# Patient Record
Sex: Male | Born: 1975 | Hispanic: Yes | Marital: Married | Smoking: Never smoker
Health system: Southern US, Community
[De-identification: ages and names within clinical notes are randomized; demographics above are authoritative.]

## PROBLEM LIST (undated history)

## (undated) DIAGNOSIS — J302 Other seasonal allergic rhinitis: Secondary | ICD-10-CM

## (undated) DIAGNOSIS — I10 Essential (primary) hypertension: Secondary | ICD-10-CM

## (undated) DIAGNOSIS — E119 Type 2 diabetes mellitus without complications: Secondary | ICD-10-CM

## (undated) DIAGNOSIS — E785 Hyperlipidemia, unspecified: Secondary | ICD-10-CM

## (undated) DIAGNOSIS — K439 Ventral hernia without obstruction or gangrene: Secondary | ICD-10-CM

## (undated) HISTORY — DX: Essential (primary) hypertension: I10

## (undated) HISTORY — DX: Ventral hernia without obstruction or gangrene: K43.9

## (undated) HISTORY — DX: Hyperlipidemia, unspecified: E78.5

## (undated) HISTORY — DX: Type 2 diabetes mellitus without complications: E11.9

## (undated) HISTORY — DX: Other seasonal allergic rhinitis: J30.2

---
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
## 2008-09-01 ENCOUNTER — Emergency Department (HOSPITAL_COMMUNITY): Admission: EM | Admit: 2008-09-01 | Discharge: 2008-09-01 | Payer: Self-pay | Admitting: Family Medicine

## 2011-03-02 ENCOUNTER — Other Ambulatory Visit (HOSPITAL_COMMUNITY): Payer: Self-pay | Admitting: Internal Medicine

## 2011-03-05 ENCOUNTER — Other Ambulatory Visit (HOSPITAL_COMMUNITY): Payer: Self-pay | Admitting: Internal Medicine

## 2011-03-05 DIAGNOSIS — R1032 Left lower quadrant pain: Secondary | ICD-10-CM

## 2011-03-09 ENCOUNTER — Ambulatory Visit (HOSPITAL_COMMUNITY)
Admission: RE | Admit: 2011-03-09 | Discharge: 2011-03-09 | Disposition: A | Discharge status: Home / Self Care | Payer: Self-pay | Source: Ambulatory Visit | Attending: Internal Medicine | Admitting: Internal Medicine

## 2011-03-09 ENCOUNTER — Other Ambulatory Visit (HOSPITAL_COMMUNITY): Payer: Self-pay | Admitting: Internal Medicine

## 2011-03-09 DIAGNOSIS — R1032 Left lower quadrant pain: Secondary | ICD-10-CM

## 2011-03-09 DIAGNOSIS — N200 Calculus of kidney: Secondary | ICD-10-CM | POA: Insufficient documentation

## 2011-03-09 DIAGNOSIS — R109 Unspecified abdominal pain: Secondary | ICD-10-CM | POA: Insufficient documentation

## 2013-04-08 IMAGING — CT CT ABD-PELV W/O CM
2 of 4 series · 17 of 46 positions shown, 19 images · non-contrast
Comparison: None.

CLINICAL DATA: Left quadrant abdominal pain.  No nausea or
vomiting.

CT ABDOMEN AND PELVIS WITHOUT CONTRAST
TECHNIQUE: Multidetector CT imaging of the abdomen and pelvis was
performed following the standard protocol without intravenous
contrast.

[Series 2: abd/pelv w/o 5.0 b31f st · axial · non-contrast · 0.77mm/px · z∈[-492,-2]mm · 14 of 108 slices shown, 16 images]
[im 5/108  soft-tissue]
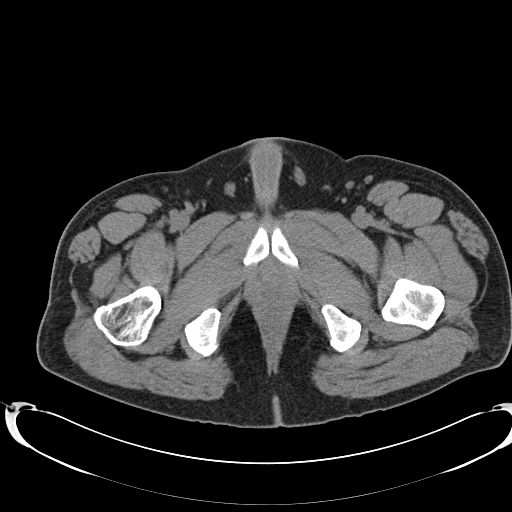
[im 5/108  bone]
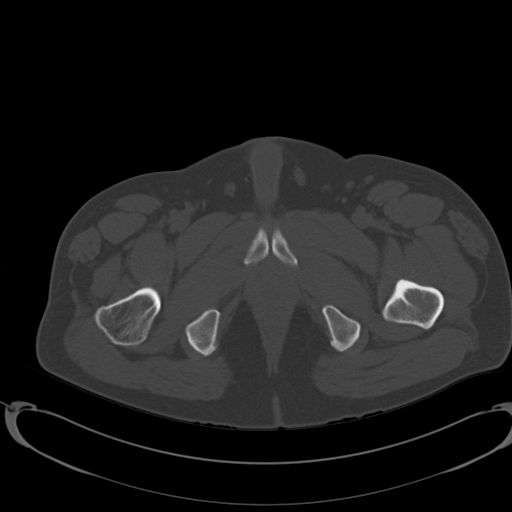
[im 14/108  soft-tissue]
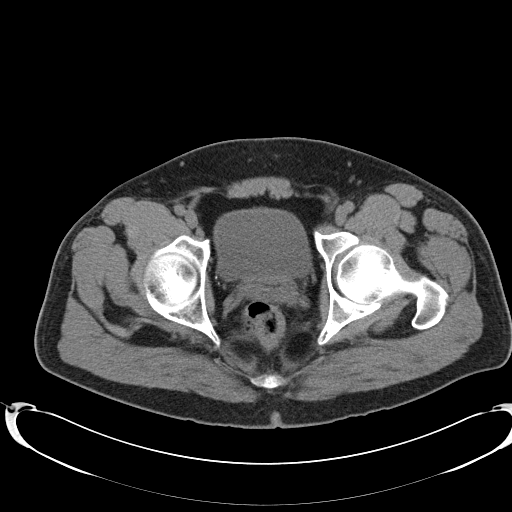
[im 23/108  soft-tissue]
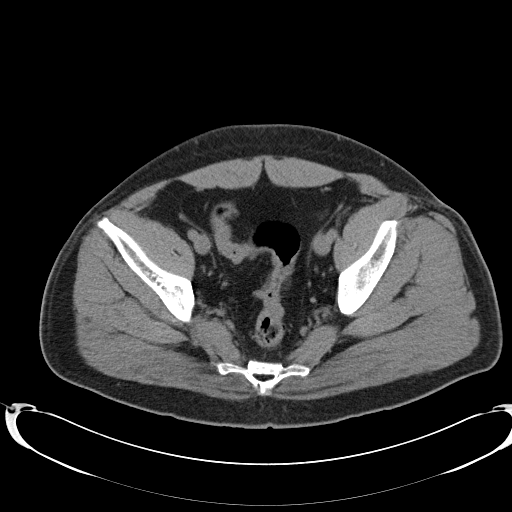
[im 27/108  soft-tissue]
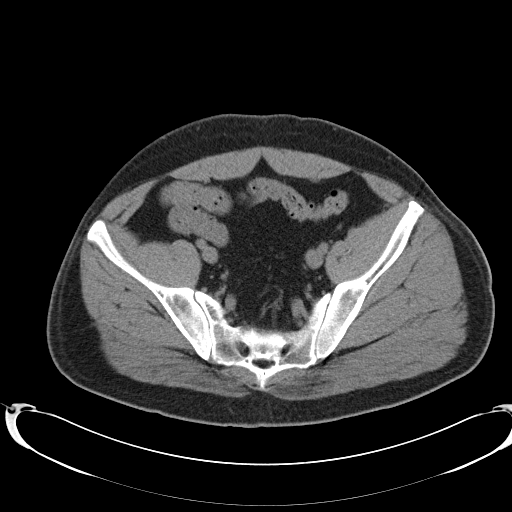
[im 36/108  soft-tissue]
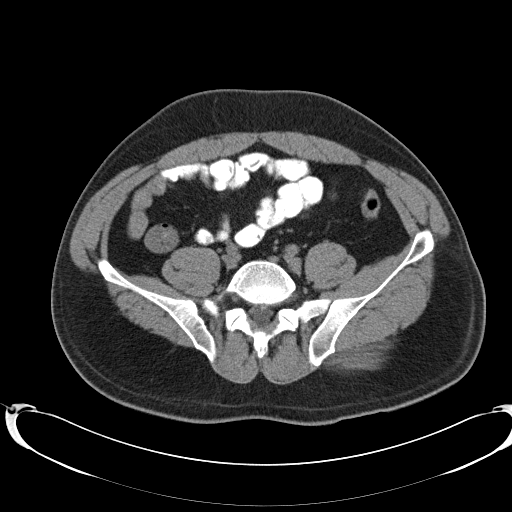
[im 45/108  soft-tissue]
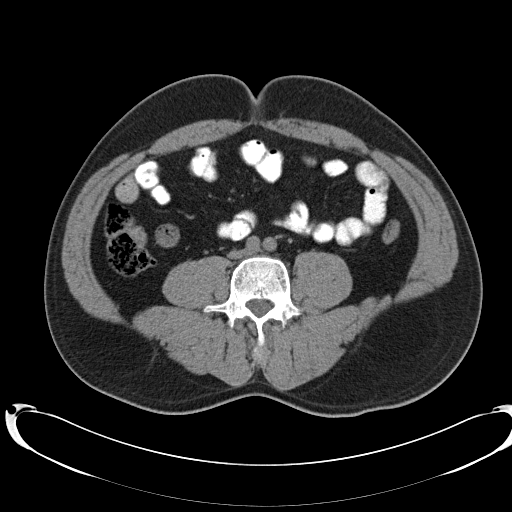
[im 50/108  soft-tissue]
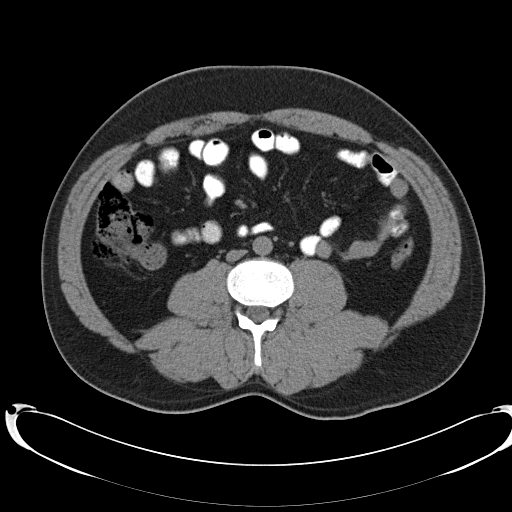
[im 58/108  soft-tissue]
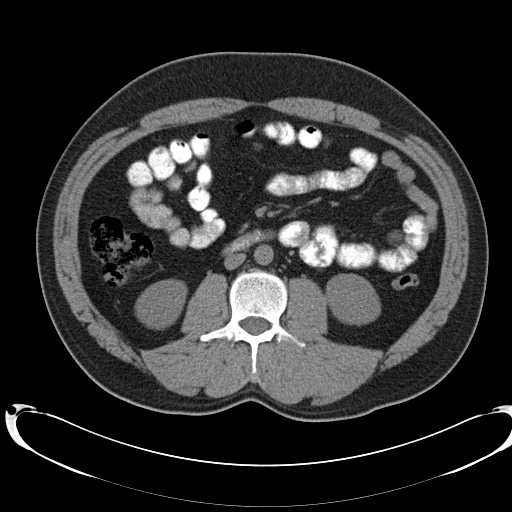
[im 63/108  soft-tissue]
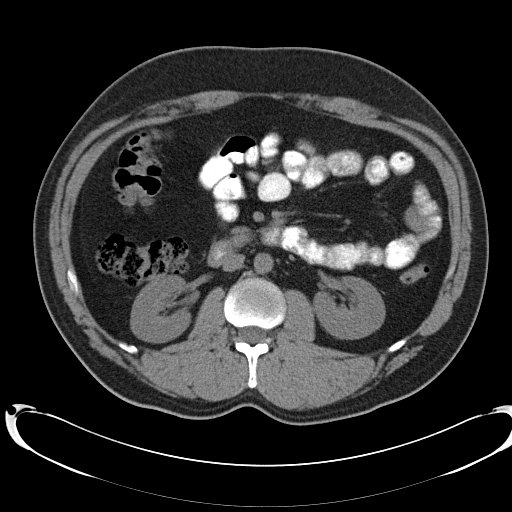
[im 63/108  bone]
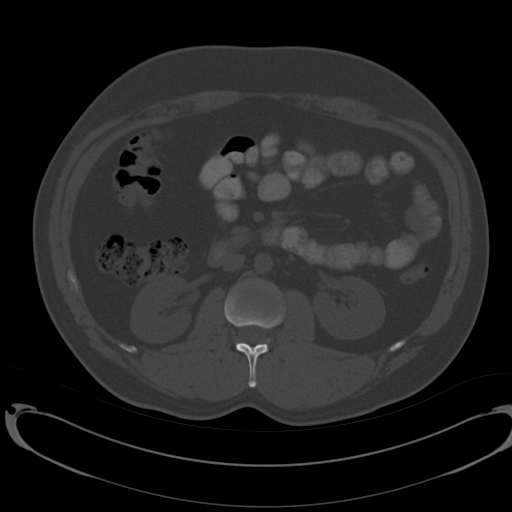
[im 72/108  soft-tissue]
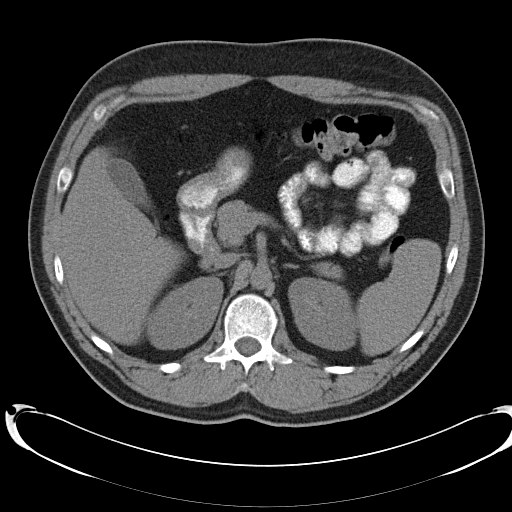
[im 81/108  soft-tissue]
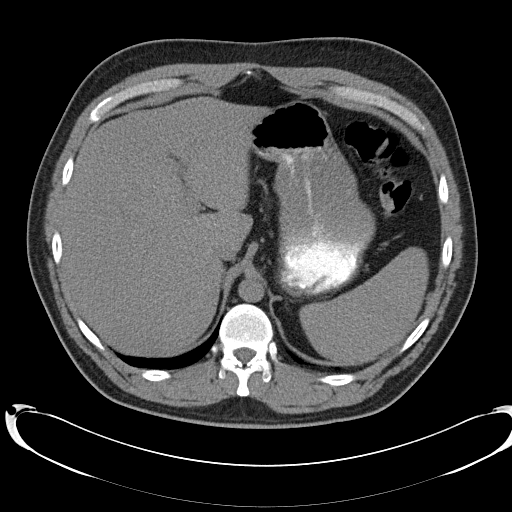
[im 85/108  soft-tissue]
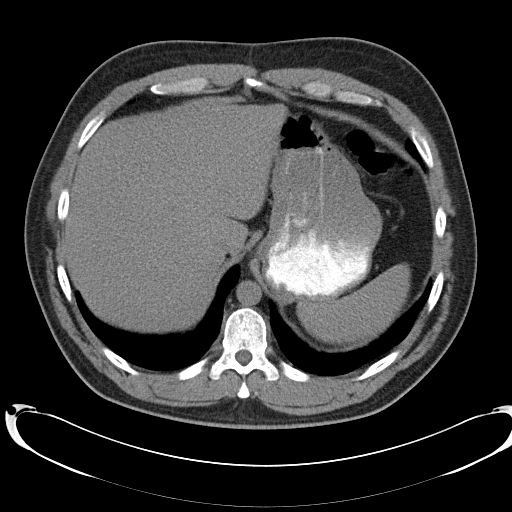
[im 94/108  soft-tissue]
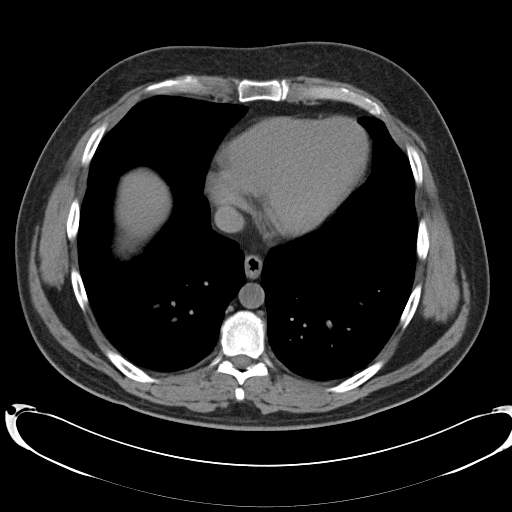
[im 103/108  soft-tissue]
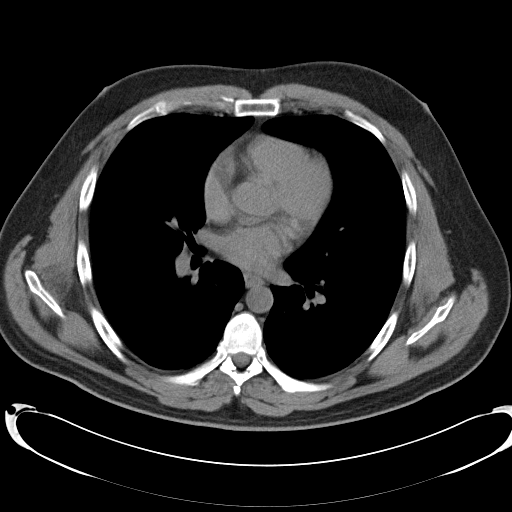

[Series 5: abd/pelv w/o 2.0 spo cor thins · coronal · non-contrast · 1.05mm/px · 3 of 112 slices shown]
[im 38/112  soft-tissue]
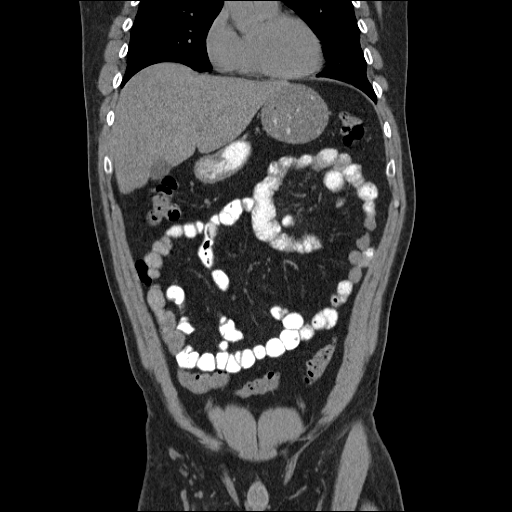
[im 50/112  soft-tissue]
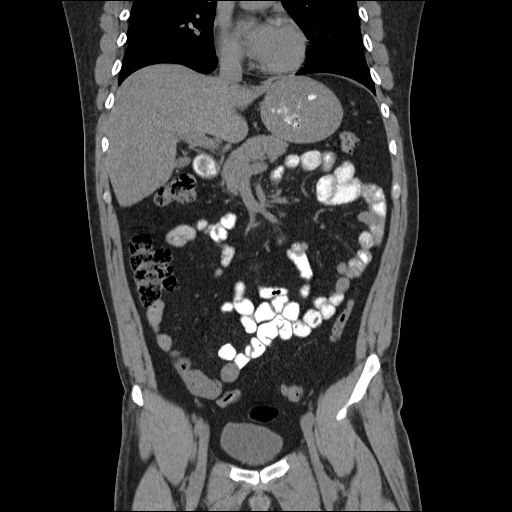
[im 62/112  soft-tissue]
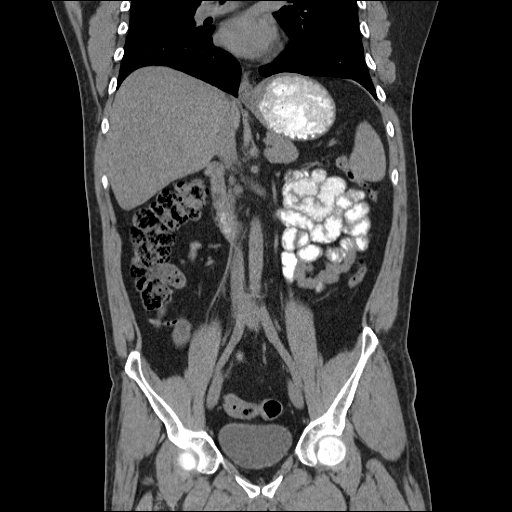

[17 of 46 positions shown; findings below may reference images not displayed]

FINDINGS: Lung bases show dependent atelectasis.  Heart appears
within normal limits.

Unenhanced CT was performed per clinician order.  Lack of IV
contrast limits sensitivity and specificity, especially for
evaluation of abdominal/pelvic solid viscera.  90th appearance of
the liver is within normal limits.  Gallbladder appears normal.  No
calcified gallstones.  Pancreas and common bile duct appear normal.
The spleen is unremarkable.  Normal adrenal glands bilaterally.

Small left inferior pole renal calculus is present measuring 4 mm
long axis.  The right kidney appears within normal limits.  No
right renal calculi are present.

No abdominal adenopathy.  Stomach appears within normal limits.
Small bowel is normal.  The appendix is normal.  Colon is within
normal limits.  Urinary bladder unremarkable.  Both ureters are
normal.  No perinephric stranding, periureteric stranding or
hydronephrosis.  Bones appear within normal limits.  No aggressive
osseous lesions are identified.  Partial visualization of right
proximal femur ORIF.
IMPRESSION: 1.  No acute abnormality.
2.  Nonobstructing 4 mm left inferior pole renal collecting system
calculus.
3.  Postoperative changes of proximal right femur ORIF.

## 2015-01-06 ENCOUNTER — Emergency Department (HOSPITAL_COMMUNITY)
Admission: EM | Admit: 2015-01-06 | Discharge: 2015-01-06 | Disposition: A | Discharge status: Home / Self Care | Payer: 59 | Attending: Emergency Medicine | Admitting: Emergency Medicine

## 2015-01-06 ENCOUNTER — Encounter (HOSPITAL_COMMUNITY): Payer: Self-pay

## 2015-01-06 DIAGNOSIS — R739 Hyperglycemia, unspecified: Secondary | ICD-10-CM | POA: Insufficient documentation

## 2015-01-06 DIAGNOSIS — Z8639 Personal history of other endocrine, nutritional and metabolic disease: Secondary | ICD-10-CM | POA: Insufficient documentation

## 2015-01-06 DIAGNOSIS — R631 Polydipsia: Secondary | ICD-10-CM | POA: Diagnosis not present

## 2015-01-06 LAB — URINALYSIS, ROUTINE W REFLEX MICROSCOPIC
Bilirubin Urine: NEGATIVE
Glucose, UA: >1000 mg/dL — AB
Ketones, ur: 40 mg/dL — AB
LEUKOCYTES UA: NEGATIVE
Nitrite: NEGATIVE
Protein, ur: 100 mg/dL — AB
Specific Gravity, Urine: 1.04 — ABNORMAL HIGH (ref 1.005–1.030)
Urobilinogen, UA: 0.2 mg/dL (ref 0.0–1.0)
pH: 6 (ref 5.0–8.0)

## 2015-01-06 LAB — CBC
HCT: 43.3 % (ref 39.0–52.0)
Hemoglobin: 15.9 g/dL (ref 13.0–17.0)
MCH: 32.9 pg (ref 26.0–34.0)
MCHC: 36.7 g/dL — ABNORMAL HIGH (ref 30.0–36.0)
MCV: 89.6 fL (ref 78.0–100.0)
PLATELETS: 187 10*3/uL (ref 150–400)
RBC: 4.83 MIL/uL (ref 4.22–5.81)
RDW: 12.5 % (ref 11.5–15.5)
WBC: 5.2 10*3/uL (ref 4.0–10.5)

## 2015-01-06 LAB — BASIC METABOLIC PANEL
ANION GAP: 9 (ref 5–15)
BUN: 11 mg/dL (ref 6–20)
CO2: 23 mmol/L (ref 22–32)
Calcium: 9.5 mg/dL (ref 8.9–10.3)
Chloride: 98 mmol/L — ABNORMAL LOW (ref 101–111)
Creatinine, Ser: 0.55 mg/dL — ABNORMAL LOW (ref 0.61–1.24)
GFR calc non Af Amer: >60 mL/min (ref >60)
GLUCOSE: 341 mg/dL — AB (ref 65–99)
POTASSIUM: 4.8 mmol/L (ref 3.5–5.1)
Sodium: 130 mmol/L — ABNORMAL LOW (ref 135–145)

## 2015-01-06 LAB — CBG MONITORING, ED: GLUCOSE-CAPILLARY: 331 mg/dL — AB (ref 65–99)

## 2015-01-06 LAB — URINE MICROSCOPIC-ADD ON

## 2015-01-06 MED ORDER — METFORMIN HCL 500 MG PO TABS
500.0000 mg | ORAL_TABLET | Freq: Once | ORAL | Status: AC
  Administered 2015-01-06: 500 mg via ORAL
  Filled 2015-01-06: qty 1

## 2015-01-06 MED ORDER — METFORMIN HCL 500 MG PO TABS: 500.0000 mg | ORAL_TABLET | Freq: Two times a day (BID) | ORAL | Status: DC

## 2015-01-06 MED ORDER — SODIUM CHLORIDE 0.9 % IV BOLUS (SEPSIS)
1000.0000 mL | Freq: Once | INTRAVENOUS | Status: AC
  Administered 2015-01-06: 1000 mL via INTRAVENOUS

## 2015-01-06 NOTE — ED Provider Notes (Signed)
CSN: [id number]     Arrival date & time 01/06/15  1236 History   First MD Initiated Contact with Patient 01/06/15 1512     Chief Complaint  Patient presents with  . Hyperglycemia     (Consider location/radiation/quality/duration/timing/severity/associated sxs/prior Treatment) HPI [name] is a 39 year old male who presents from his primary care physician after an annual exam in which they found that he was hyperglycemic greater than 600 yesterday. He returned today to his primary care doctor to be rechecked and they stated his sugar was over 300 and that he had hyperlipidemia and was told to come to the ED. He states he has been thirsty but thought it was due to being in the heat. He denies any vision changes, fever, chills, chest pain, shortness of breath, recent illness, cough, abdominal pain, nausea, vomiting, diarrhea, constipation, urinary frequency, hematuria, dysuria. History reviewed. No pertinent past medical history. History reviewed. No pertinent past surgical history. History reviewed. No pertinent family history. History  Substance Use Topics  . Smoking status: Never Smoker   . Smokeless tobacco: Not on file  . Alcohol Use: Not on file    Review of Systems  Constitutional: Negative for fever and chills.  Gastrointestinal: Negative for nausea, vomiting and abdominal pain.  Endocrine: Positive for polydipsia. Negative for polyphagia and polyuria.  Neurological: Negative for dizziness, syncope, weakness and light-headedness.  All other systems reviewed and are negative.     Allergies  Review of patient's allergies indicates no known allergies.  Home Medications   Prior to Admission medications   Medication Sig Start Date End Date Taking? Authorizing Provider  metFORMIN (GLUCOPHAGE) 500 MG tablet Take 1 tablet (500 mg total) by mouth 2 (two) times daily with a meal. 01/06/15   Clara Smolen Patel-Mills, PA-C   BP 113/63 mmHg  Pulse 63  Temp(Src) 98.1 F (36.7 C) (Oral)   Resp 16  Ht  (1.753 m)  Wt 194 lb 4.8 oz (88.134 kg)  BMI 28.68 kg/m2  SpO2 100% Physical Exam  Constitutional: He is oriented to person, place, and time. He appears well-developed and well-nourished.  HENT:  Head: Normocephalic and atraumatic.  Eyes: Conjunctivae are normal.  Neck: Normal range of motion. Neck supple.  Cardiovascular: Normal rate, regular rhythm and normal heart sounds.   Pulmonary/Chest: Effort normal and breath sounds normal. No accessory muscle usage. No respiratory distress. He has no decreased breath sounds. He has no wheezes. He has no rales. He exhibits no tenderness.  Abdominal: Soft. He exhibits no distension and no mass. There is no tenderness. There is no rigidity, no rebound and no guarding.  Musculoskeletal: Normal range of motion.  Neurological: He is alert and oriented to person, place, and time.  Skin: Skin is warm and dry.  Psychiatric: He has a normal mood and affect. His behavior is normal.  Nursing note and vitals reviewed.   ED Course  Procedures (including critical care time) Labs Review Labs Reviewed  BASIC METABOLIC PANEL - Abnormal; Notable for the following:    Sodium 130 (*)    Chloride 98 (*)    Glucose, Bld 341 (*)    Creatinine, Ser 0.55 (*)    All other components within normal limits  CBC - Abnormal; Notable for the following:    MCHC 36.7 (*)    All other components within normal limits  URINALYSIS, ROUTINE W REFLEX MICROSCOPIC (NOT AT [facility]) - Abnormal; Notable for the following:    Specific Gravity, Urine 1.040 (*)  Glucose, UA >1000 (*)    Hgb urine dipstick TRACE (*)    Ketones, ur 40 (*)    Protein, ur 100 (*)    All other components within normal limits  URINE MICROSCOPIC-ADD ON - Abnormal; Notable for the following:    Squamous Epithelial / LPF FEW (*)    All other components within normal limits  CBG MONITORING, ED - Abnormal; Notable for the following:    Glucose-Capillary 331 (*)    All other components  within normal limits    Imaging Review No results found.   EKG Interpretation None      MDM   Final diagnoses:  Hyperglycemia   Patient presents from his primary care physician for newly diagnosed hyperglycemia. He is completely asymptomatic now with no vision changes, abdominal pain, nausea, vomiting, no urinary frequency, no UTI symptoms, and no upper respiratory infection.  He has no fever, no UTI, no upper respiratory infection or any other signs of infection at this time. He has hyperglycemia of 331 but is not in DKA. I have given the patient fluids. I will give him a prescription for metformin until he follows up with his physician next Thursday. I gave the patient return precautions and he verbally agrees with the plan.      Catha Gosselin, PA-C 01/07/15 1011  Marily Memos, MD 01/07/15 1353

## 2015-01-06 NOTE — ED Notes (Signed)
Pt presents with report from [facility] for hyperglycemia from annual exam.  Pt reports "soreness" to R side abdomen "for a while".  Pt reports CBG was 620 and reports elevated cholesterol.  Pt denies any symptoms today, but reports increased thirst last week but associated it with the heat.

## 2015-01-06 NOTE — ED Provider Notes (Signed)
Medical screening examination/treatment/procedure(s) were conducted as a shared visit with non-physician practitioner(s) and myself.  I personally evaluated the patient during the encounter.  Asymptomatic hyperglycemia. New onset. Exam benign without e/o dehydration. Abdomen benign. Lungs ctab, heart rrr.   Labs without DKA. Will start metformin an already has PCP follow up in 7 days.    EKG Interpretation None        Marily Memos, MD 01/06/15 2102

## 2015-01-06 NOTE — Discharge Instructions (Signed)
Hyperglycemia Keep your follow-up appointment next week with her primary care physician. Take metformin as prescribed. Hyperglycemia occurs when the glucose (sugar) in your blood is too high. Hyperglycemia can happen for many reasons, but it most often happens to people who do not know they have diabetes or are not managing their diabetes properly.  CAUSES  Whether you have diabetes or not, there are other causes of hyperglycemia. Hyperglycemia can occur when you have diabetes, but it can also occur in other situations that you might not be as aware of, such as: Diabetes  If you have diabetes and are having problems controlling your blood glucose, hyperglycemia could occur because of some of the following reasons:  Not following your meal plan.  Not taking your diabetes medications or not taking it properly.  Exercising less or doing less activity than you normally do.  Being sick. Pre-diabetes  This cannot be ignored. Before people develop Type 2 diabetes, they almost always have "pre-diabetes." This is when your blood glucose levels are higher than normal, but not yet high enough to be diagnosed as diabetes. Research has shown that some long-term damage to the body, especially the heart and circulatory system, may already be occurring during pre-diabetes. If you take action to manage your blood glucose when you have pre-diabetes, you may delay or prevent Type 2 diabetes from developing. Stress  If you have diabetes, you may be "diet" controlled or on oral medications or insulin to control your diabetes. However, you may find that your blood glucose is higher than usual in the hospital whether you have diabetes or not. This is often referred to as "stress hyperglycemia." Stress can elevate your blood glucose. This happens because of hormones put out by the body during times of stress. If stress has been the cause of your high blood glucose, it can be followed regularly by your caregiver. That  way he/she can make sure your hyperglycemia does not continue to get worse or progress to diabetes. Steroids  Steroids are medications that act on the infection fighting system (immune system) to block inflammation or infection. One side effect can be a rise in blood glucose. Most people can produce enough extra insulin to allow for this rise, but for those who cannot, steroids make blood glucose levels go even higher. It is not unusual for steroid treatments to "uncover" diabetes that is developing. It is not always possible to determine if the hyperglycemia will go away after the steroids are stopped. A special blood test called an A1c is sometimes done to determine if your blood glucose was elevated before the steroids were started. SYMPTOMS  Thirsty.  Frequent urination.  Dry mouth.  Blurred vision.  Tired or fatigue.  Weakness.  Sleepy.  Tingling in feet or leg. DIAGNOSIS  Diagnosis is made by monitoring blood glucose in one or all of the following ways:  A1c test. This is a chemical found in your blood.  Fingerstick blood glucose monitoring.  Laboratory results. TREATMENT  First, knowing the cause of the hyperglycemia is important before the hyperglycemia can be treated. Treatment may include, but is not be limited to:  Education.  Change or adjustment in medications.  Change or adjustment in meal plan.  Treatment for an illness, infection, etc.  More frequent blood glucose monitoring.  Change in exercise plan.  Decreasing or stopping steroids.  Lifestyle changes. HOME CARE INSTRUCTIONS   Test your blood glucose as directed.  Exercise regularly. Your caregiver will give you instructions about exercise.  Pre-diabetes or diabetes which comes on with stress is helped by exercising.  Eat wholesome, balanced meals. Eat often and at regular, fixed times. Your caregiver or nutritionist will give you a meal plan to guide your sugar intake.  Being at an ideal weight  is important. If needed, losing as little as 10 to 15 pounds may help improve blood glucose levels. SEEK MEDICAL CARE IF:   You have questions about medicine, activity, or diet.  You continue to have symptoms (problems such as increased thirst, urination, or weight gain). SEEK IMMEDIATE MEDICAL CARE IF:   You are vomiting or have diarrhea.  Your breath smells fruity.  You are breathing faster or slower.  You are very sleepy or incoherent.  You have numbness, tingling, or pain in your feet or hands.  You have chest pain.  Your symptoms get worse even though you have been following your caregiver's orders.  If you have any other questions or concerns. Document Released: 11/21/2000 Document Revised: 08/20/2011 Document Reviewed: 09/24/2011 [facility] Patient Information 2015 [place], [place]. This information is not intended to replace advice given to you by your health care provider. Make sure you discuss any questions you have with your health care provider.

## 2015-03-09 ENCOUNTER — Encounter: Payer: 59 | Attending: Family Medicine | Admitting: *Deleted

## 2015-03-09 ENCOUNTER — Encounter: Payer: Self-pay | Admitting: *Deleted

## 2015-03-09 VITALS — Ht 69.0 in | Wt 187.8 lb

## 2015-03-09 DIAGNOSIS — Z713 Dietary counseling and surveillance: Secondary | ICD-10-CM | POA: Insufficient documentation

## 2015-03-09 DIAGNOSIS — E119 Type 2 diabetes mellitus without complications: Secondary | ICD-10-CM | POA: Insufficient documentation

## 2015-03-09 NOTE — Patient Instructions (Signed)
Plan:  Aim for 3 Carb Choices per meal (45 grams) +/- 1 either way  Aim for 0-15 Carbs per snack if hungry  Include protein in moderation with your meals and snacks Consider reading food labels for Total Carbohydrate and Fat Grams of foods Consider  increasing your activity level by walking for 15-30 minutes daily as tolerated Consider checking BG at alternate times to include fasting and 2hpp as directed by MD  Continue  taking medication as directed by MD  Limit beer intake to 1 occassionally

## 2015-03-09 NOTE — Progress Notes (Signed)
Diabetes Self-Management Education  Visit Type: First/Initial  Appt. Start Time: 1600 Appt. End Time: 1730  03/09/2015  [name], identified by name and date of birth, is a 39 y.o. male with a diagnosis of Diabetes: Type 2. Christopher Hurley come with his wife for Diabetes Self-Management Education (DSME). Christopher Hurley speaks full english, his wife understands some. 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 He eats breakfast at [employer], his wife sends him lunch daily. The family eats dinner around 5:00 and Christopher Hurley eats around 7:00pm upon arriving home from work. He likes to have a couple beer in the evening. I have discouraged to drinking of beer on a regular basis. His wife is adamant about him not drinking beer. I noted that it was OK from a diabetes perspective to have one on occasion.  In 2010 Christopher Hurley was seen at [facility]. He was given a prescription for Metformin . Christopher Hurley stated that he felt fine and did not take the medication.  ASSESSMENT  Height  (1.753 m), weight 187 lb 12.8 oz (85.186 kg). Body mass index is 27.72 kg/(m^2).      Diabetes Self-Management Education - 03/09/15 1616    Visit Information   Visit Type First/Initial   Initial Visit   Diabetes Type Type 2   Are you taking your medications as prescribed? Yes   Date Diagnosed 2010  RX metformin, did not take medication because he felt fine   Health Coping   How would you rate your overall health? Fair   Psychosocial Assessment   Self-care barriers English as a second language   Self-management support Doctor's office;Family;CDE visits   Other persons present Patient;Spouse/SO   Patient Concerns Nutrition/Meal planning;Medication;Healthy Lifestyle;Glycemic Control   Special Needs None   Preferred Learning Style No preference indicated   Learning Readiness Change in progress   Complications   Last HgB A1C per patient/outside source 10.5 %   How often do you check your blood sugar? 1-2 times/day   Fasting Blood  glucose range (mg/dL) 16-109  604   Number of hypoglycemic episodes per month 0   Have you had a dilated eye exam in the past 12 months? No   Have you had a dental exam in the past 12 months? No   Are you checking your feet? No   Dietary Intake   Breakfast sandwich whole wheat bread, egg, lettuce, tomato,, [place] sandwich on english muffin   Lunch steamed vegetables, chicken, salade,    Dinner rice, tortilla, beans, meat,vegetables   Beverage(s) sweet tea, beer, regular soda, water   Exercise   Exercise Type ADL's   Patient Education   Previous Diabetes Education No   Disease state  Factors that contribute to the development of diabetes   Nutrition management  Role of diet in the treatment of diabetes and the relationship between the three main macronutrients and blood glucose level;Food label reading, portion sizes and measuring food.;Carbohydrate counting;Effects of alcohol on blood glucose and safety factors with consumption of alcohol.   Physical activity and exercise  Role of exercise on diabetes management, blood pressure control and cardiac health.;Helped patient identify appropriate exercises in relation to his/her diabetes, diabetes complications and other health issue.   Monitoring Purpose and frequency of SMBG.   Chronic complications Relationship between chronic complications and blood glucose control   Psychosocial adjustment Worked with patient to identify barriers to care and solutions   Personal strategies to promote health Lifestyle issues that need to be addressed  for better diabetes care   Individualized Goals (developed by patient)   Nutrition General guidelines for healthy choices and portions discussed   Physical Activity Exercise 5-7 days per week;15 minutes per day   Medications take my medication as prescribed   Monitoring  test blood glucose pre and post meals as discussed   Outcomes   Expected Outcomes Demonstrated interest in learning. Expect positive  outcomes   Future DMSE 2 months   Program Status Completed      Individualized Plan for Diabetes Self-Management Training:   Learning Objective:  Patient will have a greater understanding of diabetes self-management. Patient education plan is to attend individual and/or group sessions per assessed needs and concerns.   Plan:   Patient Instructions  Plan:  Aim for 3 Carb Choices per meal (45 grams) +/- 1 either way  Aim for 0-15 Carbs per snack if hungry  Include protein in moderation with your meals and snacks Consider reading food labels for Total Carbohydrate and Fat Grams of foods Consider  increasing your activity level by walking for 15-30 minutes daily as tolerated Consider checking BG at alternate times to include fasting and 2hpp as directed by MD  Continue  taking medication as directed by MD  Limit beer intake to 1 occassionally    Expected Outcomes:  Demonstrated interest in learning. Expect positive outcomes  Education material provided: Living Well with Diabetes(spanish), A1C conversion sheet, Meal plan card(spanish), My Plate and Snack sheet  If problems or questions, patient to contact team via:  Phone  Future DSME appointment: 2 months

## 2015-05-10 ENCOUNTER — Ambulatory Visit: Payer: 59 | Admitting: *Deleted

## 2015-05-17 ENCOUNTER — Encounter: Payer: 59 | Attending: Family Medicine | Admitting: *Deleted

## 2015-05-17 ENCOUNTER — Encounter: Payer: Self-pay | Admitting: *Deleted

## 2015-05-17 DIAGNOSIS — E119 Type 2 diabetes mellitus without complications: Secondary | ICD-10-CM | POA: Insufficient documentation

## 2015-05-17 DIAGNOSIS — Z713 Dietary counseling and surveillance: Secondary | ICD-10-CM | POA: Insufficient documentation

## 2015-05-17 NOTE — Patient Instructions (Signed)
You are doing a great job! Keep it up! Snacks: [place]  Balanced Breaks                [facility] Protein Bar                Yogurt: Dannon Light & fit Greek Yogurt                Low carbohydrates and high protein

## 2018-09-16 DIAGNOSIS — I10 Essential (primary) hypertension: Secondary | ICD-10-CM | POA: Insufficient documentation

## 2019-02-12 DIAGNOSIS — E119 Type 2 diabetes mellitus without complications: Secondary | ICD-10-CM | POA: Insufficient documentation

## 2019-02-12 DIAGNOSIS — Z8781 Personal history of (healed) traumatic fracture: Secondary | ICD-10-CM | POA: Insufficient documentation

## 2019-02-22 DIAGNOSIS — Z23 Encounter for immunization: Secondary | ICD-10-CM | POA: Insufficient documentation

## 2019-02-22 DIAGNOSIS — M25551 Pain in right hip: Secondary | ICD-10-CM | POA: Insufficient documentation

## 2019-02-22 DIAGNOSIS — R200 Anesthesia of skin: Secondary | ICD-10-CM | POA: Insufficient documentation

## 2019-02-22 DIAGNOSIS — M79671 Pain in right foot: Secondary | ICD-10-CM | POA: Insufficient documentation

## 2019-02-22 DIAGNOSIS — Z6828 Body mass index (BMI) 28.0-28.9, adult: Secondary | ICD-10-CM | POA: Insufficient documentation

## 2019-08-20 ENCOUNTER — Ambulatory Visit: Payer: Self-pay | Attending: Internal Medicine

## 2019-08-20 DIAGNOSIS — Z23 Encounter for immunization: Secondary | ICD-10-CM

## 2019-08-20 NOTE — Progress Notes (Signed)
   Covid-19 Vaccination Clinic  Name:  Christopher Hurley    MRN: [id number] DOB: 28-Jul-1975  08/20/2019  [name] was observed post Covid-19 immunization for 15 minutes without incident. He was provided with Vaccine Information Sheet and instruction to access the V-Safe system.   [name] was instructed to call 911 with any severe reactions post vaccine: Marland Kitchen Difficulty breathing  . Swelling of face and throat  . A fast heartbeat  . A bad rash all over body  . Dizziness and weakness   Immunizations Administered    Name Date Dose VIS Date Route   Pfizer COVID-19 Vaccine 08/20/2019  4:29 PM 0.3 mL 05/22/2019 Intramuscular   Manufacturer: [employer], [employer]   Lot: [id number]   NDC: [address]-4

## 2019-08-28 ENCOUNTER — Other Ambulatory Visit: Payer: Self-pay | Admitting: Internal Medicine

## 2019-08-28 ENCOUNTER — Encounter: Payer: Self-pay | Admitting: Internal Medicine

## 2019-08-28 ENCOUNTER — Other Ambulatory Visit: Payer: Self-pay

## 2019-08-28 ENCOUNTER — Ambulatory Visit (INDEPENDENT_AMBULATORY_CARE_PROVIDER_SITE_OTHER): Payer: 59 | Admitting: Internal Medicine

## 2019-08-28 VITALS — BP 120/80 | HR 79 | Temp 97.9°F | Ht 69.5 in | Wt 187.0 lb

## 2019-08-28 DIAGNOSIS — E559 Vitamin D deficiency, unspecified: Secondary | ICD-10-CM

## 2019-08-28 DIAGNOSIS — E119 Type 2 diabetes mellitus without complications: Secondary | ICD-10-CM | POA: Diagnosis not present

## 2019-08-28 DIAGNOSIS — E785 Hyperlipidemia, unspecified: Secondary | ICD-10-CM | POA: Insufficient documentation

## 2019-08-28 DIAGNOSIS — J302 Other seasonal allergic rhinitis: Secondary | ICD-10-CM | POA: Diagnosis not present

## 2019-08-28 DIAGNOSIS — I10 Essential (primary) hypertension: Secondary | ICD-10-CM | POA: Diagnosis not present

## 2019-08-28 DIAGNOSIS — R5383 Other fatigue: Secondary | ICD-10-CM | POA: Diagnosis not present

## 2019-08-28 DIAGNOSIS — K439 Ventral hernia without obstruction or gangrene: Secondary | ICD-10-CM

## 2019-08-28 LAB — CBC WITH DIFFERENTIAL/PLATELET
Basophils Absolute: 0 10*3/uL (ref 0.0–0.1)
Basophils Relative: 0.4 % (ref 0.0–3.0)
Eosinophils Absolute: 0.1 10*3/uL (ref 0.0–0.7)
Eosinophils Relative: 2.4 % (ref 0.0–5.0)
HCT: 43.4 % (ref 39.0–52.0)
Hemoglobin: 15 g/dL (ref 13.0–17.0)
Lymphocytes Relative: 24.2 % (ref 12.0–46.0)
Lymphs Abs: 1.2 10*3/uL (ref 0.7–4.0)
MCHC: 34.7 g/dL (ref 30.0–36.0)
MCV: 95.7 fl (ref 78.0–100.0)
Monocytes Absolute: 0.4 10*3/uL (ref 0.1–1.0)
Monocytes Relative: 7.6 % (ref 3.0–12.0)
Neutro Abs: 3.2 10*3/uL (ref 1.4–7.7)
Neutrophils Relative %: 65.4 % (ref 43.0–77.0)
Platelets: 220 10*3/uL (ref 150.0–400.0)
RBC: 4.53 Mil/uL (ref 4.22–5.81)
RDW: 12.9 % (ref 11.5–15.5)
WBC: 4.9 10*3/uL (ref 4.0–10.5)

## 2019-08-28 LAB — COMPREHENSIVE METABOLIC PANEL
ALT: 37 U/L (ref 0–53)
AST: 17 U/L (ref 0–37)
Albumin: 4.4 g/dL (ref 3.5–5.2)
Alkaline Phosphatase: 80 U/L (ref 39–117)
BUN: 14 mg/dL (ref 6–23)
CO2: 28 mEq/L (ref 19–32)
Calcium: 9.6 mg/dL (ref 8.4–10.5)
Chloride: 99 mEq/L (ref 96–112)
Creatinine, Ser: 0.74 mg/dL (ref 0.40–1.50)
GFR: 114.62 mL/min (ref >60.00)
Glucose, Bld: 117 mg/dL — ABNORMAL HIGH (ref 70–99)
Potassium: 4.3 mEq/L (ref 3.5–5.1)
Sodium: 135 mEq/L (ref 135–145)
Total Bilirubin: 0.8 mg/dL (ref 0.2–1.2)
Total Protein: 7.3 g/dL (ref 6.0–8.3)

## 2019-08-28 LAB — LIPID PANEL
Cholesterol: 205 mg/dL — ABNORMAL HIGH (ref 0–200)
HDL: 45.8 mg/dL (ref >39.00)
LDL Cholesterol: 124 mg/dL — ABNORMAL HIGH (ref 0–99)
NonHDL: 159.67
Total CHOL/HDL Ratio: 4
Triglycerides: 179 mg/dL — ABNORMAL HIGH (ref 0.0–149.0)
VLDL: 35.8 mg/dL (ref 0.0–40.0)

## 2019-08-28 LAB — POCT GLYCOSYLATED HEMOGLOBIN (HGB A1C): Hemoglobin A1C: 6.1 % — AB (ref 4.0–5.6)

## 2019-08-28 LAB — TSH: TSH: 1.75 u[IU]/mL (ref 0.35–4.50)

## 2019-08-28 LAB — VITAMIN B12: Vitamin B-12: 462 pg/mL (ref 211–911)

## 2019-08-28 LAB — VITAMIN D 25 HYDROXY (VIT D DEFICIENCY, FRACTURES): VITD: 22.38 ng/mL — ABNORMAL LOW (ref 30.00–100.00)

## 2019-08-28 MED ORDER — ATORVASTATIN CALCIUM 40 MG PO TABS: 40.0000 mg | ORAL_TABLET | Freq: Every day | ORAL | 1 refills | Status: DC

## 2019-08-28 MED ORDER — VITAMIN D (ERGOCALCIFEROL) 1.25 MG (50000 UNIT) PO CAPS: 50000.0000 [IU] | ORAL_CAPSULE | ORAL | 0 refills | Status: AC

## 2019-08-28 NOTE — Progress Notes (Signed)
New Patient Office Visit     This visit occurred during the SARS-CoV-2 public health emergency.  Safety protocols were in place, including screening questions prior to the visit, additional usage of staff PPE, and extensive cleaning of exam room while observing appropriate contact time as indicated for disinfecting solutions.    CC/Reason for Visit: Establish care, discuss chronic conditions and some acute concerns Previous PCP: Unknown Last Visit: 2019  HPI: Christopher Hurley is a 44 y.o. male who is coming in today for the above mentioned reasons. Past Medical History is significant for: Hypertension that has been well controlled on lisinopril/hydrochlorothiazide, well-controlled type 2 diabetes with an A1c of 6.1 in 2019, hyperlipidemia not on a statin, seasonal allergies for which she takes an antihistamine as needed.  He has been complaining of some excessive fatigue and cramping of his legs.  He works in an [employer], he does not smoke, he is to drink every day 3-4 beers but for the last 12 years only drinks occasionally.  He has no known drug allergies.  His family history significant for the mother who is deceased from pulmonary fibrosis and a father who has diabetes, hypertension and hyperlipidemia.   Past Medical/Surgical History: Past Medical History:  Diagnosis Date  . Diabetes mellitus without complication (HCC)   . Hyperlipidemia   . Hypertension   . Seasonal allergies   . Ventral hernia     History reviewed. No pertinent surgical history.  Social History:  reports that he has never smoked. He has never used smokeless tobacco. He reports current alcohol use. He reports that he does not use drugs.  Allergies: Allergies  Allergen Reactions  . Atorvastatin Nausea Only    Atorvastatin caused nausea / vomiting but also metformin was started at the same time for DM2 causing diarrhea x 1 week . Most likely GI side effects were due to metformin      Family History:  Family History  Problem Relation Age of Onset  . Diabetes Mother   . Diabetes Father      Current Outpatient Medications:  .  aspirin 81 MG EC tablet, Take 81 mg by mouth daily., Disp: , Rfl:  .  lisinopril-hydrochlorothiazide (PRINZIDE,ZESTORETIC) 20-25 MG tablet, Take 1 tablet by mouth daily., Disp: , Rfl:  .  metFORMIN (GLUCOPHAGE) 500 MG tablet, Take 1 tablet (500 mg total) by mouth 2 (two) times daily with a meal., Disp: 20 tablet, Rfl: 0  Review of Systems:  Constitutional: Denies fever, chills, diaphoresis, appetite change. HEENT: Denies photophobia, eye pain, redness, hearing loss, ear pain, congestion, sore throat, rhinorrhea, sneezing, mouth sores, trouble swallowing, neck pain, neck stiffness and tinnitus.   Respiratory: Denies SOB, DOE, cough, chest tightness,  and wheezing.   Cardiovascular: Denies chest pain, palpitations and leg swelling.  Gastrointestinal: Denies nausea, vomiting, abdominal pain, diarrhea, constipation, blood in stool and abdominal distention.  Genitourinary: Denies dysuria, urgency, frequency, hematuria, flank pain and difficulty urinating.  Endocrine: Denies: hot or cold intolerance, sweats, changes in hair or nails, polyuria, polydipsia. Musculoskeletal: Denies myalgias, back pain, joint swelling, arthralgias and gait problem.  Skin: Denies pallor, rash and wound.  Neurological: Denies dizziness, seizures, syncope, weakness, light-headedness  and headaches.  Hematological: Denies adenopathy. Easy bruising, personal or family bleeding history  Psychiatric/Behavioral: Denies suicidal ideation, mood changes, confusion, nervousness, sleep disturbance and agitation    Physical Exam: Vitals:   08/28/19 1031  BP: 120/80  Pulse: 79  Temp: 97.9 F (36.6 C)  TempSrc:  Temporal  SpO2: 98%  Weight: 187 lb (84.8 kg)  Height: 5' 9.5" (1.765 m)   Body mass index is 27.22 kg/m.  Constitutional: NAD, calm, comfortable Eyes: PERRL,  lids and conjunctivae normal ENMT: Mucous membranes are moist. Tympanic membrane is pearly white, no erythema or bulging. Neck: normal, supple, no masses, no thyromegaly Respiratory: clear to auscultation bilaterally, no wheezing, no crackles. Normal respiratory effort. No accessory muscle use.  Cardiovascular: Regular rate and rhythm, no murmurs / rubs / gallops. No extremity edema. 2+ pedal pulses. No carotid bruits.  Abdomen: no tenderness, no masses palpated. No hepatosplenomegaly. Bowel sounds positive.  Musculoskeletal: no clubbing / cyanosis. No joint deformity upper and lower extremities. Good ROM, no contractures. Normal muscle tone.  Skin: no rashes, lesions, ulcers. No induration Neurologic: CN 2-12 grossly intact. Sensation intact, DTR normal. Strength 5/5 in all 4.  Psychiatric: Normal judgment and insight. Alert and oriented x 3. Normal mood.    Impression and Plan:  Type 2 diabetes mellitus without complication, without long-term current use of insulin ([place])  -Well-controlled with an A1c of 6.1 today. -We will need to discuss eye exam and foot exam at next visit.  Essential hypertension -Well-controlled, continue current medications.  Hyperlipidemia, unspecified hyperlipidemia type  - Plan: Lipid panel -Goal LDL is less than 70, last LDL was 125 in August 2019.  Seasonal allergies -Start daily antihistamine.  Ventral hernia without obstruction or gangrene -Noted.  Fatigue, unspecified type  - Plan: TSH, Vitamin B12, VITAMIN D 25 Hydroxy (Vit-D Deficiency, Fractures), CBC     Patient Instructions  -Nice seeing you today!!  -Lab work today; will notify you once results are available.  -Start Zyrtec 1 tablet daily for your allergies.  -Schedule follow up in 3 months.     Lelon Frohlich, MD [facility] at [facility]

## 2019-08-28 NOTE — Patient Instructions (Signed)
-  Nice seeing you today!!  -Lab work today; will notify you once results are available.  -Start Zyrtec 1 tablet daily for your allergies.  -Schedule follow up in 3 months.

## 2019-08-28 NOTE — Addendum Note (Signed)
Addended by: Bonnye Fava on: 08/28/2019 11:09 AM   Modules accepted: Orders

## 2019-09-01 ENCOUNTER — Other Ambulatory Visit: Payer: Self-pay | Admitting: Internal Medicine

## 2019-09-01 DIAGNOSIS — E559 Vitamin D deficiency, unspecified: Secondary | ICD-10-CM

## 2019-09-01 DIAGNOSIS — E785 Hyperlipidemia, unspecified: Secondary | ICD-10-CM

## 2019-09-09 ENCOUNTER — Other Ambulatory Visit: Payer: Self-pay

## 2019-09-09 ENCOUNTER — Encounter: Payer: Self-pay | Admitting: Internal Medicine

## 2019-09-09 ENCOUNTER — Ambulatory Visit (INDEPENDENT_AMBULATORY_CARE_PROVIDER_SITE_OTHER): Payer: 59 | Admitting: Internal Medicine

## 2019-09-09 VITALS — BP 110/70 | HR 88 | Temp 97.6°F | Wt 188.8 lb

## 2019-09-09 DIAGNOSIS — E559 Vitamin D deficiency, unspecified: Secondary | ICD-10-CM | POA: Diagnosis not present

## 2019-09-09 DIAGNOSIS — J302 Other seasonal allergic rhinitis: Secondary | ICD-10-CM

## 2019-09-09 DIAGNOSIS — E785 Hyperlipidemia, unspecified: Secondary | ICD-10-CM

## 2019-09-09 NOTE — Progress Notes (Signed)
Established Patient Office Visit     This visit occurred during the SARS-CoV-2 public health emergency.  Safety protocols were in place, including screening questions prior to the visit, additional usage of staff PPE, and extensive cleaning of exam room while observing appropriate contact time as indicated for disinfecting solutions.    CC/Reason for Visit: Questions ab hyperlipidemia and vitamin D deficiency  HPI: Christopher Hurley is a 44 y.o. male who is coming in today for the above mentioned reasons. Past Medical History is significant for: Well-controlled on Metformin, well-controlled hypertension, recent diagnosis of hyperlipidemia started on Lipitor 2 weeks ago, recent diagnosis on vitamin D deficiency on high-dose weekly vitamin D.  He also has a history of seasonal allergies.  He is concerned about increased sinus drainage and coughing.  He has concerns about stomach upset with Lipitor that improved once he started taking it in the evening instead of in the morning.  He also wonders for how long he needs to be on vitamin D.   Past Medical/Surgical History: Past Medical History:  Diagnosis Date  . Diabetes mellitus without complication ([place])   . Hyperlipidemia   . Hypertension   . Seasonal allergies   . Ventral hernia     No past surgical history on file.  Social History:  reports that he has never smoked. He has never used smokeless tobacco. He reports current alcohol use. He reports that he does not use drugs.  Allergies: Allergies  Allergen Reactions  . Atorvastatin Nausea Only    Atorvastatin caused nausea / vomiting but also metformin was started at the same time for DM2 causing diarrhea x 1 week . Most likely GI side effects were due to metformin     Family History:  Family History  Problem Relation Age of Onset  . Diabetes Mother   . Diabetes Father      Current Outpatient Medications:  .  aspirin 81 MG EC tablet, Take 81 mg by mouth daily.,  Disp: , Rfl:  .  atorvastatin (LIPITOR) 40 MG tablet, Take 1 tablet (40 mg total) by mouth daily., Disp: 90 tablet, Rfl: 1 .  lisinopril-hydrochlorothiazide (PRINZIDE,ZESTORETIC) 20-25 MG tablet, Take 1 tablet by mouth daily., Disp: , Rfl:  .  metFORMIN (GLUCOPHAGE) 500 MG tablet, Take 1 tablet (500 mg total) by mouth 2 (two) times daily with a meal., Disp: 20 tablet, Rfl: 0 .  Vitamin D, Ergocalciferol, (DRISDOL) 1.25 MG (50000 UNIT) CAPS capsule, Take 1 capsule (50,000 Units total) by mouth every 7 (seven) days for 12 doses., Disp: 12 capsule, Rfl: 0  Review of Systems:  Constitutional: Denies fever, chills, diaphoresis, appetite change and fatigue.  HEENT: Denies photophobia, eye pain, redness, hearing loss, ear pain, congestion, sore throat, rhinorrhea, sneezing, mouth sores, trouble swallowing, neck pain, neck stiffness and tinnitus.   Respiratory: Denies SOB, DOE, cough, chest tightness,  and wheezing.   Cardiovascular: Denies chest pain, palpitations and leg swelling.  Gastrointestinal: Denies nausea, vomiting, abdominal pain, diarrhea, constipation, blood in stool and abdominal distention.  Genitourinary: Denies dysuria, urgency, frequency, hematuria, flank pain and difficulty urinating.  Endocrine: Denies: hot or cold intolerance, sweats, changes in hair or nails, polyuria, polydipsia. Musculoskeletal: Denies myalgias, back pain, joint swelling, arthralgias and gait problem.  Skin: Denies pallor, rash and wound.  Neurological: Denies dizziness, seizures, syncope, weakness, light-headedness, numbness and headaches.  Hematological: Denies adenopathy. Easy bruising, personal or family bleeding history  Psychiatric/Behavioral: Denies suicidal ideation, mood changes, confusion, nervousness, sleep disturbance and  agitation    Physical Exam: Vitals:   09/09/19 0937  BP: 110/70  Pulse: 88  Temp: 97.6 F (36.4 C)  TempSrc: Temporal  SpO2: 98%  Weight: 188 lb 12.8 oz (85.6 kg)     Body mass index is 27.48 kg/m.   Constitutional: NAD, calm, comfortable Eyes: PERRL, lids and conjunctivae normal ENMT: Mucous membranes are moist.  Neurologic: Grossly intact and nonfocal Psychiatric: Normal judgment and insight. Alert and oriented x 3. Normal mood.    Impression and Plan:  Seasonal allergies -Nasal drainage, sore throat and coughing likely related to spring season and pollen. -Advised daily use of OTC antihistamine.  Hyperlipidemia, unspecified hyperlipidemia type -Advised to continue taking Lipitor in the evenings, recheck lipids and LFTs in 6 months.  Vitamin D deficiency -Plan for high-dose weekly supplementation for 12 weeks with follow-up levels then.    Patient Instructions  -Nice seeing you today!!  -See you back in 3 months.     Chaya Jan, MD [facility] at [facility]

## 2019-09-09 NOTE — Patient Instructions (Signed)
-  Nice seeing you today!!  -See you back in 3 months. 

## 2019-09-14 ENCOUNTER — Ambulatory Visit: Payer: 59 | Attending: Internal Medicine

## 2019-09-14 DIAGNOSIS — Z23 Encounter for immunization: Secondary | ICD-10-CM

## 2019-09-14 NOTE — Progress Notes (Signed)
   Covid-19 Vaccination Clinic  Name:  Jeriko Kowalke    MRN: [id number] DOB: 24-Jul-1975  09/14/2019  Mr. Chaplin was observed post Covid-19 immunization for 15 minutes without incident. He was provided with Vaccine Information Sheet and instruction to access the V-Safe system.   Mr. Lax was instructed to call 911 with any severe reactions post vaccine: Marland Kitchen Difficulty breathing  . Swelling of face and throat  . A fast heartbeat  . A bad rash all over body  . Dizziness and weakness   Immunizations Administered    Name Date Dose VIS Date Route   Pfizer COVID-19 Vaccine 09/14/2019 11:44 AM 0.3 mL 05/22/2019 Intramuscular   Manufacturer: [employer], [employer]   Lot: [id number]   NDC: [address]-0

## 2019-09-21 ENCOUNTER — Telehealth: Payer: Self-pay | Admitting: *Deleted

## 2019-09-21 ENCOUNTER — Other Ambulatory Visit: Payer: Self-pay | Admitting: *Deleted

## 2019-09-21 MED ORDER — LISINOPRIL-HYDROCHLOROTHIAZIDE 20-25 MG PO TABS: 1.0000 | ORAL_TABLET | Freq: Every day | ORAL | 1 refills | Status: DC

## 2019-09-21 NOTE — Telephone Encounter (Signed)
Patient called after hours line on 09/19/2019. Patient reports he ran out of his blood pressure medication and the pharmacy called and the doctor has not called back yet.  Clinic RN refilled meds for 2 months as patient needs a f/u appt for 11/2019

## 2019-09-22 NOTE — Telephone Encounter (Signed)
noted 

## 2019-11-03 ENCOUNTER — Other Ambulatory Visit: Payer: Self-pay | Admitting: Internal Medicine

## 2019-11-05 ENCOUNTER — Ambulatory Visit: Payer: Self-pay | Admitting: Internal Medicine

## 2019-11-10 ENCOUNTER — Other Ambulatory Visit: Payer: Self-pay | Admitting: Internal Medicine

## 2019-11-20 ENCOUNTER — Other Ambulatory Visit: Payer: Self-pay | Admitting: Internal Medicine

## 2019-11-20 DIAGNOSIS — E559 Vitamin D deficiency, unspecified: Secondary | ICD-10-CM

## 2019-12-07 ENCOUNTER — Other Ambulatory Visit (INDEPENDENT_AMBULATORY_CARE_PROVIDER_SITE_OTHER): Payer: 59

## 2019-12-07 ENCOUNTER — Other Ambulatory Visit: Payer: Self-pay

## 2019-12-07 DIAGNOSIS — E559 Vitamin D deficiency, unspecified: Secondary | ICD-10-CM

## 2019-12-07 DIAGNOSIS — E785 Hyperlipidemia, unspecified: Secondary | ICD-10-CM

## 2019-12-07 LAB — LIPID PANEL
Cholesterol: 226 mg/dL — ABNORMAL HIGH (ref 0–200)
HDL: 40.3 mg/dL (ref >39.00)
Total CHOL/HDL Ratio: 6
Triglycerides: 428 mg/dL — ABNORMAL HIGH (ref 0.0–149.0)

## 2019-12-07 LAB — LDL CHOLESTEROL, DIRECT: Direct LDL: 84 mg/dL

## 2019-12-07 LAB — VITAMIN D 25 HYDROXY (VIT D DEFICIENCY, FRACTURES): VITD: 27.42 ng/mL — ABNORMAL LOW (ref 30.00–100.00)

## 2019-12-08 ENCOUNTER — Other Ambulatory Visit: Payer: Self-pay | Admitting: Internal Medicine

## 2019-12-08 DIAGNOSIS — E559 Vitamin D deficiency, unspecified: Secondary | ICD-10-CM

## 2019-12-08 MED ORDER — VITAMIN D (ERGOCALCIFEROL) 1.25 MG (50000 UNIT) PO CAPS: 50000.0000 [IU] | ORAL_CAPSULE | ORAL | 0 refills | Status: AC

## 2019-12-09 ENCOUNTER — Other Ambulatory Visit: Payer: Self-pay | Admitting: Internal Medicine

## 2019-12-09 DIAGNOSIS — E559 Vitamin D deficiency, unspecified: Secondary | ICD-10-CM

## 2020-01-11 ENCOUNTER — Other Ambulatory Visit: Payer: Self-pay | Admitting: Internal Medicine

## 2020-01-22 NOTE — Addendum Note (Signed)
Addended by: Lerry Liner on: 01/22/2020 01:47 PM   Modules accepted: Orders

## 2020-03-07 ENCOUNTER — Other Ambulatory Visit (INDEPENDENT_AMBULATORY_CARE_PROVIDER_SITE_OTHER): Payer: 59

## 2020-03-07 ENCOUNTER — Other Ambulatory Visit: Payer: Self-pay

## 2020-03-07 DIAGNOSIS — E559 Vitamin D deficiency, unspecified: Secondary | ICD-10-CM

## 2020-03-07 LAB — VITAMIN D 25 HYDROXY (VIT D DEFICIENCY, FRACTURES): Vit D, 25-Hydroxy: 67 ng/mL (ref 30–100)

## 2020-03-10 ENCOUNTER — Other Ambulatory Visit: Payer: Self-pay | Admitting: Internal Medicine

## 2020-05-02 ENCOUNTER — Other Ambulatory Visit: Payer: Self-pay

## 2020-05-02 ENCOUNTER — Other Ambulatory Visit: Payer: Self-pay | Admitting: Internal Medicine

## 2020-05-02 ENCOUNTER — Ambulatory Visit: Payer: 59 | Attending: Internal Medicine

## 2020-05-02 ENCOUNTER — Ambulatory Visit (INDEPENDENT_AMBULATORY_CARE_PROVIDER_SITE_OTHER): Payer: 59 | Admitting: *Deleted

## 2020-05-02 DIAGNOSIS — E119 Type 2 diabetes mellitus without complications: Secondary | ICD-10-CM

## 2020-05-02 DIAGNOSIS — Z23 Encounter for immunization: Secondary | ICD-10-CM | POA: Diagnosis not present

## 2020-05-02 NOTE — Progress Notes (Signed)
   Covid-19 Vaccination Clinic  Name:  Christopher Hurley    MRN: [id number] DOB: 09-23-1975  05/02/2020  [name] was observed post Covid-19 immunization for 15 minutes without incident. He was provided with Vaccine Information Sheet and instruction to access the V-Safe system.   [name] was instructed to call 911 with any severe reactions post vaccine: Marland Kitchen Difficulty breathing  . Swelling of face and throat  . A fast heartbeat  . A bad rash all over body  . Dizziness and weakness   Immunizations Administered    Name Date Dose VIS Date Route   Pfizer COVID-19 Vaccine 05/02/2020  2:56 PM 0.3 mL 03/30/2020 Intramuscular   Manufacturer: [employer], [employer]   Lot: [id number]   NDC: [address]-7

## 2020-05-04 ENCOUNTER — Other Ambulatory Visit: Payer: Self-pay | Admitting: Internal Medicine

## 2020-06-29 ENCOUNTER — Ambulatory Visit: Payer: Self-pay | Admitting: Internal Medicine

## 2020-07-22 ENCOUNTER — Encounter: Payer: Self-pay | Admitting: Internal Medicine

## 2020-07-22 ENCOUNTER — Other Ambulatory Visit: Payer: Self-pay

## 2020-07-22 ENCOUNTER — Ambulatory Visit (INDEPENDENT_AMBULATORY_CARE_PROVIDER_SITE_OTHER): Payer: 59 | Admitting: Internal Medicine

## 2020-07-22 VITALS — BP 120/60 | HR 73 | Temp 98.1°F | Ht 69.5 in | Wt 183.4 lb

## 2020-07-22 DIAGNOSIS — E785 Hyperlipidemia, unspecified: Secondary | ICD-10-CM | POA: Diagnosis not present

## 2020-07-22 DIAGNOSIS — I10 Essential (primary) hypertension: Secondary | ICD-10-CM | POA: Diagnosis not present

## 2020-07-22 DIAGNOSIS — E559 Vitamin D deficiency, unspecified: Secondary | ICD-10-CM

## 2020-07-22 DIAGNOSIS — E119 Type 2 diabetes mellitus without complications: Secondary | ICD-10-CM | POA: Diagnosis not present

## 2020-07-22 LAB — POCT GLYCOSYLATED HEMOGLOBIN (HGB A1C): Hemoglobin A1C: 9.6 % — AB (ref 4.0–5.6)

## 2020-07-22 NOTE — Progress Notes (Signed)
Established Patient Office Visit     This visit occurred during the SARS-CoV-2 public health emergency.  Safety protocols were in place, including screening questions prior to the visit, additional usage of staff PPE, and extensive cleaning of exam room while observing appropriate contact time as indicated for disinfecting solutions.    CC/Reason for Visit: Follow-up chronic conditions  HPI: Christopher Hurley is a 45 y.o. male who is coming in today for the above mentioned reasons. Past Medical History is significant for: Type 2 diabetes that has been well controlled with an A1c of 6.1 in March 2021, well-controlled hypertension, hyperlipidemia and vitamin D deficiency.  I have not seen him since March 2021.  He visited with extended family in [place] over the holidays.  Unfortunately, he and his family tested positive for Covid and had to delay his return back into the [place].  He has recovered well from COVID-19.  But due to his extended stay he ran out of his Metformin, he also admits to significant dietary indiscretions.  He has noticed that his CBGs have been as high as 300.   Past Medical/Surgical History: Past Medical History:  Diagnosis Date  . Diabetes mellitus without complication (HCC)   . Hyperlipidemia   . Hypertension   . Seasonal allergies   . Ventral hernia     No past surgical history on file.  Social History:  reports that he has never smoked. He has never used smokeless tobacco. He reports current alcohol use. He reports that he does not use drugs.  Allergies: Allergies  Allergen Reactions  . Atorvastatin Nausea Only    Atorvastatin caused nausea / vomiting but also metformin was started at the same time for DM2 causing diarrhea x 1 week . Most likely GI side effects were due to metformin     Family History:  Family History  Problem Relation Age of Onset  . Diabetes Mother   . Diabetes Father      Current Outpatient Medications:  .  ASPIRIN LOW  DOSE 81 MG EC tablet, TAKE 1 TABLET BY MOUTH DAILY, Disp: 90 tablet, Rfl: 2 .  atorvastatin (LIPITOR) 40 MG tablet, Take 1 tablet (40 mg total) by mouth daily., Disp: 90 tablet, Rfl: 1 .  lisinopril-hydrochlorothiazide (ZESTORETIC) 20-25 MG tablet, TAKE 1 TABLET BY MOUTH DAILY, Disp: 90 tablet, Rfl: 1 .  metFORMIN (GLUCOPHAGE) 500 MG tablet, TAKE 2 TABLETS BY MOUTH DAILY WITH BREAKFAST, Disp: 180 tablet, Rfl: 0  Review of Systems:  Constitutional: Denies fever, chills, diaphoresis, appetite change and fatigue.  HEENT: Denies photophobia, eye pain, redness, hearing loss, ear pain, congestion, sore throat, rhinorrhea, sneezing, mouth sores, trouble swallowing, neck pain, neck stiffness and tinnitus.   Respiratory: Denies SOB, DOE, cough, chest tightness,  and wheezing.   Cardiovascular: Denies chest pain, palpitations and leg swelling.  Gastrointestinal: Denies nausea, vomiting, abdominal pain, diarrhea, constipation, blood in stool and abdominal distention.  Genitourinary: Denies dysuria, urgency, frequency, hematuria, flank pain and difficulty urinating.  Endocrine: Denies: hot or cold intolerance, sweats, changes in hair or nails, polyuria, polydipsia. Musculoskeletal: Denies myalgias, back pain, joint swelling, arthralgias and gait problem.  Skin: Denies pallor, rash and wound.  Neurological: Denies dizziness, seizures, syncope, weakness, light-headedness, numbness and headaches.  Hematological: Denies adenopathy. Easy bruising, personal or family bleeding history  Psychiatric/Behavioral: Denies suicidal ideation, mood changes, confusion, nervousness, sleep disturbance and agitation    Physical Exam: Vitals:   07/22/20 0907  BP: 120/60  Pulse: 73  Temp:  98.1 F (36.7 C)  TempSrc: Oral  SpO2: 98%  Weight: 183 lb 6.4 oz (83.2 kg)  Height: 5' 9.5" (1.765 m)    Body mass index is 26.7 kg/m.   Constitutional: NAD, calm, comfortable Eyes: PERRL, lids and conjunctivae normal ENMT:  Mucous membranes are moist.  Respiratory: clear to auscultation bilaterally, no wheezing, no crackles. Normal respiratory effort. No accessory muscle use.  Cardiovascular: Regular rate and rhythm, no murmurs / rubs / gallops. No extremity edema.  Neurologic: Grossly intact and nonfocal Psychiatric: Normal judgment and insight. Alert and oriented x 3. Normal mood.    Impression and Plan:  Type 2 diabetes mellitus without complication, without long-term current use of insulin (HCC) -As expected, A1c has increased significantly above goal at 9.6 today. -He has resumed taking 1000 mg daily Metformin and is focusing on diet, he has noted his CBGs have started to decrease to the mid 100s. -I will not make medication changes as he has been well controlled in the past.  Will allow 80-month grace period.  Primary hypertension -Blood pressure has been well controlled on lisinopril/hydrochlorothiazide.  Hyperlipidemia, unspecified hyperlipidemia type -Last LDL was 84 in June 2021, he was started on atorvastatin 40 mg. -Recheck lipids when he returns for CPE.  Vitamin D deficiency -Recheck levels when he returns for CPE.     Chaya Jan, MD [facility] at [facility]

## 2020-08-17 ENCOUNTER — Other Ambulatory Visit: Payer: Self-pay | Admitting: Internal Medicine

## 2020-08-17 DIAGNOSIS — E785 Hyperlipidemia, unspecified: Secondary | ICD-10-CM

## 2020-08-18 ENCOUNTER — Telehealth: Payer: Self-pay | Admitting: Internal Medicine

## 2020-08-18 MED ORDER — ACCU-CHEK SOFTCLIX LANCETS MISC: 3 refills | Status: DC

## 2020-08-18 MED ORDER — ACCU-CHEK GUIDE VI STRP: ORAL_STRIP | 3 refills | Status: DC

## 2020-08-18 NOTE — Telephone Encounter (Signed)
Rx done. 

## 2020-08-18 NOTE — Telephone Encounter (Signed)
Pt is calling in needing a refill on Rx Accu chek lancets and test strips he is out.   Pharm:  [facility] on [address]

## 2020-09-28 LAB — HM DIABETES EYE EXAM

## 2020-11-13 ENCOUNTER — Other Ambulatory Visit: Payer: Self-pay | Admitting: Internal Medicine

## 2021-02-16 ENCOUNTER — Other Ambulatory Visit: Payer: Self-pay | Admitting: Internal Medicine

## 2021-02-22 ENCOUNTER — Other Ambulatory Visit: Payer: Self-pay | Admitting: Internal Medicine

## 2021-05-20 ENCOUNTER — Other Ambulatory Visit: Payer: Self-pay | Admitting: Internal Medicine

## 2021-05-25 ENCOUNTER — Other Ambulatory Visit: Payer: Self-pay | Admitting: Internal Medicine

## 2021-06-12 ENCOUNTER — Other Ambulatory Visit: Payer: Self-pay | Admitting: Internal Medicine

## 2021-06-22 ENCOUNTER — Other Ambulatory Visit: Payer: Self-pay | Admitting: Internal Medicine

## 2021-06-22 ENCOUNTER — Telehealth: Payer: Self-pay | Admitting: Internal Medicine

## 2021-06-22 MED ORDER — LISINOPRIL-HYDROCHLOROTHIAZIDE 20-25 MG PO TABS: 1.0000 | ORAL_TABLET | Freq: Every day | ORAL | 0 refills | Status: DC

## 2021-06-22 NOTE — Telephone Encounter (Signed)
Rx sent in for 30 days, pt needs to schedule a follow up appointment.

## 2021-06-22 NOTE — Telephone Encounter (Signed)
Pt call and stated he need a refill on lisinopril-hydrochlorothiazide (ZESTORETIC) 20-25 MG tablet  sent to  [facility], [place] AT [address] Phone:  [phone]  Fax:  [phone]

## 2021-07-20 ENCOUNTER — Other Ambulatory Visit: Payer: Self-pay | Admitting: Internal Medicine

## 2021-07-27 ENCOUNTER — Encounter: Payer: Self-pay | Admitting: Internal Medicine

## 2021-07-27 ENCOUNTER — Ambulatory Visit (INDEPENDENT_AMBULATORY_CARE_PROVIDER_SITE_OTHER): Payer: 59 | Admitting: Internal Medicine

## 2021-07-27 VITALS — BP 118/76 | HR 73 | Temp 98.8°F | Ht 69.5 in | Wt 179.8 lb

## 2021-07-27 DIAGNOSIS — E559 Vitamin D deficiency, unspecified: Secondary | ICD-10-CM

## 2021-07-27 DIAGNOSIS — E119 Type 2 diabetes mellitus without complications: Secondary | ICD-10-CM | POA: Diagnosis not present

## 2021-07-27 DIAGNOSIS — I10 Essential (primary) hypertension: Secondary | ICD-10-CM

## 2021-07-27 DIAGNOSIS — E785 Hyperlipidemia, unspecified: Secondary | ICD-10-CM | POA: Diagnosis not present

## 2021-07-27 LAB — POCT GLYCOSYLATED HEMOGLOBIN (HGB A1C): Hemoglobin A1C: 9.6 % — AB (ref 4.0–5.6)

## 2021-07-27 MED ORDER — METFORMIN HCL 1000 MG PO TABS: 1000.0000 mg | ORAL_TABLET | Freq: Two times a day (BID) | ORAL | 1 refills | Status: DC

## 2021-07-27 MED ORDER — LISINOPRIL-HYDROCHLOROTHIAZIDE 20-25 MG PO TABS: 1.0000 | ORAL_TABLET | Freq: Every day | ORAL | 1 refills | Status: DC

## 2021-07-27 MED ORDER — ATORVASTATIN CALCIUM 40 MG PO TABS: ORAL_TABLET | ORAL | 1 refills | Status: DC

## 2021-07-27 NOTE — Progress Notes (Signed)
Established Patient Office Visit     This visit occurred during the SARS-CoV-2 public health emergency.  Safety protocols were in place, including screening questions prior to the visit, additional usage of staff PPE, and extensive cleaning of exam room while observing appropriate contact time as indicated for disinfecting solutions.    CC/Reason for Visit: Follow-up chronic conditions  HPI: Christopher Hurley is a 46 y.o. male who is coming in today for the above mentioned reasons. Past Medical History is significant for: Hypertension, hyperlipidemia, type 2 diabetes, vitamin D deficiency.  I have not seen him since February 2022.  He has been compliant with all medications.  He did receive his flu and his COVID booster in December.   Past Medical/Surgical History: Past Medical History:  Diagnosis Date   Diabetes mellitus without complication ([place])    Hyperlipidemia    Hypertension    Seasonal allergies    Ventral hernia     No past surgical history on file.  Social History:  reports that he has never smoked. He has never used smokeless tobacco. He reports current alcohol use. He reports that he does not use drugs.  Allergies: No Active Allergies  Family History:  Family History  Problem Relation Age of Onset   Diabetes Mother    Diabetes Father      Current Outpatient Medications:    Accu-Chek Softclix Lancets lancets, Use as instructed, Disp: 100 each, Rfl: 3   ASPIRIN LOW DOSE 81 MG EC tablet, TAKE 1 TABLET BY MOUTH DAILY, Disp: 90 tablet, Rfl: 2   glucose blood (ACCU-CHEK GUIDE) test strip, Use as instructed, Disp: 100 each, Rfl: 3   atorvastatin (LIPITOR) 40 MG tablet, TAKE 1 TABLET(40 MG) BY MOUTH DAILY, Disp: 90 tablet, Rfl: 1   lisinopril-hydrochlorothiazide (ZESTORETIC) 20-25 MG tablet, Take 1 tablet by mouth daily., Disp: 90 tablet, Rfl: 1   metFORMIN (GLUCOPHAGE) 1000 MG tablet, Take 1 tablet (1,000 mg total) by mouth 2 (two) times daily with a  meal., Disp: 180 tablet, Rfl: 1  Review of Systems:  Constitutional: Denies fever, chills, diaphoresis, appetite change and fatigue.  HEENT: Denies photophobia, eye pain, redness, hearing loss, ear pain, congestion, sore throat, rhinorrhea, sneezing, mouth sores, trouble swallowing, neck pain, neck stiffness and tinnitus.   Respiratory: Denies SOB, DOE, cough, chest tightness,  and wheezing.   Cardiovascular: Denies chest pain, palpitations and leg swelling.  Gastrointestinal: Denies nausea, vomiting, abdominal pain, diarrhea, constipation, blood in stool and abdominal distention.  Genitourinary: Denies dysuria, urgency, frequency, hematuria, flank pain and difficulty urinating.  Endocrine: Denies: hot or cold intolerance, sweats, changes in hair or nails, polyuria, polydipsia. Musculoskeletal: Denies myalgias, back pain, joint swelling, arthralgias and gait problem.  Skin: Denies pallor, rash and wound.  Neurological: Denies dizziness, seizures, syncope, weakness, light-headedness, numbness and headaches.  Hematological: Denies adenopathy. Easy bruising, personal or family bleeding history  Psychiatric/Behavioral: Denies suicidal ideation, mood changes, confusion, nervousness, sleep disturbance and agitation    Physical Exam: Vitals:   07/27/21 1314  BP: 118/76  Pulse: 73  Temp: 98.8 F (37.1 C)  TempSrc: Oral  SpO2: 99%  Weight: 179 lb 12.8 oz (81.6 kg)  Height: 5' 9.5" (1.765 m)    Body mass index is 26.17 kg/m.   Constitutional: NAD, calm, comfortable Eyes: PERRL, lids and conjunctivae normal ENMT: Mucous membranes are moist.  Respiratory: clear to auscultation bilaterally, no wheezing, no crackles. Normal respiratory effort. No accessory muscle use.  Cardiovascular: Regular rate and rhythm, no  murmurs / rubs / gallops. No extremity edema.  Neurologic: Grossly intact and nonfocal Psychiatric: Normal judgment and insight. Alert and oriented x 3. Normal mood.     Impression and Plan:  Type 2 diabetes mellitus without complication, without long-term current use of insulin ([place]) - Plan: POC HgB A1c, metFORMIN (GLUCOPHAGE) 1000 MG tablet  Primary hypertension - Plan: lisinopril-hydrochlorothiazide (ZESTORETIC) 20-25 MG tablet  Hyperlipidemia, unspecified hyperlipidemia type - Plan: atorvastatin (LIPITOR) 40 MG tablet  Vitamin D deficiency  -Unsurprisingly, in office A1c is uncontrolled at 9.6. -Increase metformin to 1000 mg twice daily, he has been advised importance of lifestyle changes and every 3 to 27-month follow-up. -Blood pressure is well controlled, continue lisinopril/hydrochlorothiazide. -He has been compliant with atorvastatin 40 mg daily, he is not fasting today, he will return in 3 months for labs.  Time spent: 32 minutes reviewing chart, interviewing and examining patient and formulating plan of care.     Lelon Frohlich, MD [facility] at [facility]

## 2021-08-21 ENCOUNTER — Other Ambulatory Visit: Payer: Self-pay | Admitting: Internal Medicine

## 2021-08-21 DIAGNOSIS — E785 Hyperlipidemia, unspecified: Secondary | ICD-10-CM

## 2021-10-04 LAB — HM DIABETES EYE EXAM

## 2021-10-05 ENCOUNTER — Encounter: Payer: Self-pay | Admitting: Internal Medicine

## 2022-01-01 ENCOUNTER — Other Ambulatory Visit: Payer: Self-pay | Admitting: Internal Medicine

## 2022-01-01 DIAGNOSIS — E785 Hyperlipidemia, unspecified: Secondary | ICD-10-CM

## 2022-01-02 ENCOUNTER — Other Ambulatory Visit: Payer: Self-pay | Admitting: Internal Medicine

## 2022-01-08 ENCOUNTER — Ambulatory Visit (INDEPENDENT_AMBULATORY_CARE_PROVIDER_SITE_OTHER): Payer: 59 | Admitting: Internal Medicine

## 2022-01-08 ENCOUNTER — Encounter: Payer: Self-pay | Admitting: Internal Medicine

## 2022-01-08 VITALS — BP 100/68 | HR 75 | Temp 98.3°F | Wt 180.0 lb

## 2022-01-08 DIAGNOSIS — I10 Essential (primary) hypertension: Secondary | ICD-10-CM | POA: Diagnosis not present

## 2022-01-08 DIAGNOSIS — E559 Vitamin D deficiency, unspecified: Secondary | ICD-10-CM | POA: Diagnosis not present

## 2022-01-08 DIAGNOSIS — E785 Hyperlipidemia, unspecified: Secondary | ICD-10-CM

## 2022-01-08 DIAGNOSIS — E1169 Type 2 diabetes mellitus with other specified complication: Secondary | ICD-10-CM

## 2022-01-08 LAB — CBC WITH DIFFERENTIAL/PLATELET
Basophils Absolute: 0 10*3/uL (ref 0.0–0.1)
Basophils Relative: 0.3 % (ref 0.0–3.0)
Eosinophils Absolute: 0.1 10*3/uL (ref 0.0–0.7)
Eosinophils Relative: 1.7 % (ref 0.0–5.0)
HCT: 42.4 % (ref 39.0–52.0)
Hemoglobin: 14.6 g/dL (ref 13.0–17.0)
Lymphocytes Relative: 26.5 % (ref 12.0–46.0)
Lymphs Abs: 1.6 10*3/uL (ref 0.7–4.0)
MCHC: 34.5 g/dL (ref 30.0–36.0)
MCV: 95.8 fl (ref 78.0–100.0)
Monocytes Absolute: 0.4 10*3/uL (ref 0.1–1.0)
Monocytes Relative: 6.8 % (ref 3.0–12.0)
Neutro Abs: 3.8 10*3/uL (ref 1.4–7.7)
Neutrophils Relative %: 64.7 % (ref 43.0–77.0)
Platelets: 212 10*3/uL (ref 150.0–400.0)
RBC: 4.43 Mil/uL (ref 4.22–5.81)
RDW: 13 % (ref 11.5–15.5)
WBC: 5.9 10*3/uL (ref 4.0–10.5)

## 2022-01-08 LAB — PSA: PSA: 0.37 ng/mL (ref 0.10–4.00)

## 2022-01-08 LAB — POCT GLYCOSYLATED HEMOGLOBIN (HGB A1C): Hemoglobin A1C: 7.8 % — AB (ref 4.0–5.6)

## 2022-01-08 LAB — VITAMIN D 25 HYDROXY (VIT D DEFICIENCY, FRACTURES): VITD: 19.56 ng/mL — ABNORMAL LOW (ref 30.00–100.00)

## 2022-01-08 MED ORDER — BLOOD GLUCOSE METER KIT: PACK | 0 refills | Status: DC

## 2022-01-08 NOTE — Progress Notes (Signed)
Established Patient Office Visit     CC/Reason for Visit: Follow-up chronic conditions  HPI: Christopher Hurley is a 46 y.o. male who is coming in today for the above mentioned reasons. Past Medical History is significant for: Hypertension, hyperlipidemia, type 2 diabetes, vitamin D deficiency.  At last visit his A1c was 9.6.  We doubled his metformin dose.  He has been taking at 1000 mg twice daily and is tolerating it well.  He is also working on lifestyle changes.  He has no acute concerns or complaints today.  He is requesting a prescription for new glucometer and supplies.   Past Medical/Surgical History: Past Medical History:  Diagnosis Date   Diabetes mellitus without complication ([place])    Hyperlipidemia    Hypertension    Seasonal allergies    Ventral hernia     History reviewed. No pertinent surgical history.  Social History:  reports that he has never smoked. He has never used smokeless tobacco. He reports current alcohol use. He reports that he does not use drugs.  Allergies: No Active Allergies  Family History:  Family History  Problem Relation Age of Onset   Diabetes Mother    Diabetes Father      Current Outpatient Medications:    Accu-Chek Softclix Lancets lancets, USE AS DIRECTED, Disp: 100 each, Rfl: 3   ASPIRIN LOW DOSE 81 MG EC tablet, TAKE 1 TABLET BY MOUTH DAILY, Disp: 90 tablet, Rfl: 2   atorvastatin (LIPITOR) 40 MG tablet, TAKE 1 TABLET(40 MG) BY MOUTH DAILY, Disp: 30 tablet, Rfl: 0   blood glucose meter kit and supplies, Dispense based on patient and insurance preference. Use up to four times daily as directed. (FOR ICD-10 E10.9, E11.9)., Disp: 1 each, Rfl: 0   glucose blood (ACCU-CHEK GUIDE) test strip, USE AS DIRECTED, Disp: 100 strip, Rfl: 3   lisinopril-hydrochlorothiazide (ZESTORETIC) 20-25 MG tablet, Take 1 tablet by mouth daily., Disp: 90 tablet, Rfl: 1   metFORMIN (GLUCOPHAGE) 1000 MG tablet, Take 1 tablet (1,000 mg total) by mouth  2 (two) times daily with a meal., Disp: 180 tablet, Rfl: 1  Review of Systems:  Constitutional: Denies fever, chills, diaphoresis, appetite change and fatigue.  HEENT: Denies photophobia, eye pain, redness, hearing loss, ear pain, congestion, sore throat, rhinorrhea, sneezing, mouth sores, trouble swallowing, neck pain, neck stiffness and tinnitus.   Respiratory: Denies SOB, DOE, cough, chest tightness,  and wheezing.   Cardiovascular: Denies chest pain, palpitations and leg swelling.  Gastrointestinal: Denies nausea, vomiting, abdominal pain, diarrhea, constipation, blood in stool and abdominal distention.  Genitourinary: Denies dysuria, urgency, frequency, hematuria, flank pain and difficulty urinating.  Endocrine: Denies: hot or cold intolerance, sweats, changes in hair or nails, polyuria, polydipsia. Musculoskeletal: Denies myalgias, back pain, joint swelling, arthralgias and gait problem.  Skin: Denies pallor, rash and wound.  Neurological: Denies dizziness, seizures, syncope, weakness, light-headedness, numbness and headaches.  Hematological: Denies adenopathy. Easy bruising, personal or family bleeding history  Psychiatric/Behavioral: Denies suicidal ideation, mood changes, confusion, nervousness, sleep disturbance and agitation    Physical Exam: Vitals:   01/08/22 1445  BP: 100/68  Pulse: 75  Temp: 98.3 F (36.8 C)  SpO2: 98%  Weight: 180 lb (81.6 kg)    Body mass index is 26.2 kg/m.   Constitutional: NAD, calm, comfortable Eyes: PERRL, lids and conjunctivae normal ENMT: Mucous membranes are moist.  Respiratory: clear to auscultation bilaterally, no wheezing, no crackles. Normal respiratory effort. No accessory muscle use.  Cardiovascular: Regular rate  and rhythm, no murmurs / rubs / gallops. No extremity edema.  Psychiatric: Normal judgment and insight. Alert and oriented x 3. Normal mood.    Impression and Plan:  Type 2 diabetes mellitus with other specified  complication, without long-term current use of insulin ([place])  - Plan: POC HgB A1c, CBC with Differential/Platelet, Comprehensive metabolic panel, PSA, Microalbumin / creatinine urine ratio -A1c shows improvement from 9.6 down to 7.8 today.  He will continue to work on lifestyle changes, if not closer to 7 may need to add medication next visit.  Primary hypertension -Blood pressure well controlled on lisinopril/HCT.  Hyperlipidemia, unspecified hyperlipidemia type  - Plan: Lipid panel -Currently on atorvastatin 40 mg daily.  Vitamin D deficiency  - Plan: VITAMIN D 25 Hydroxy (Vit-D Deficiency, Fractures)    Time spent:33 minutes reviewing chart, interviewing and examining patient and formulating plan of care.    Lelon Frohlich, MD [facility] at [facility]

## 2022-01-09 ENCOUNTER — Other Ambulatory Visit: Payer: Self-pay | Admitting: Internal Medicine

## 2022-01-09 ENCOUNTER — Encounter: Payer: Self-pay | Admitting: Internal Medicine

## 2022-01-09 DIAGNOSIS — E785 Hyperlipidemia, unspecified: Secondary | ICD-10-CM

## 2022-01-09 DIAGNOSIS — E559 Vitamin D deficiency, unspecified: Secondary | ICD-10-CM

## 2022-01-09 DIAGNOSIS — E1129 Type 2 diabetes mellitus with other diabetic kidney complication: Secondary | ICD-10-CM | POA: Insufficient documentation

## 2022-01-09 LAB — COMPREHENSIVE METABOLIC PANEL
ALT: 29 U/L (ref 0–53)
AST: 16 U/L (ref 0–37)
Albumin: 4.7 g/dL (ref 3.5–5.2)
Alkaline Phosphatase: 79 U/L (ref 39–117)
BUN: 15 mg/dL (ref 6–23)
CO2: 28 mEq/L (ref 19–32)
Calcium: 9.6 mg/dL (ref 8.4–10.5)
Chloride: 97 mEq/L (ref 96–112)
Creatinine, Ser: 0.76 mg/dL (ref 0.40–1.50)
GFR: 107.49 mL/min (ref >60.00)
Glucose, Bld: 132 mg/dL — ABNORMAL HIGH (ref 70–99)
Potassium: 3.7 mEq/L (ref 3.5–5.1)
Sodium: 136 mEq/L (ref 135–145)
Total Bilirubin: 0.8 mg/dL (ref 0.2–1.2)
Total Protein: 7.6 g/dL (ref 6.0–8.3)

## 2022-01-09 LAB — LIPID PANEL
Cholesterol: 197 mg/dL (ref 0–200)
HDL: 56.6 mg/dL (ref >39.00)
NonHDL: 140.39
Total CHOL/HDL Ratio: 3
Triglycerides: 281 mg/dL — ABNORMAL HIGH (ref 0.0–149.0)
VLDL: 56.2 mg/dL — ABNORMAL HIGH (ref 0.0–40.0)

## 2022-01-09 LAB — MICROALBUMIN / CREATININE URINE RATIO
Creatinine,U: 136.6 mg/dL
Microalb Creat Ratio: 10.3 mg/g (ref 0.0–30.0)
Microalb, Ur: 14.1 mg/dL — ABNORMAL HIGH (ref 0.0–1.9)

## 2022-01-09 LAB — LDL CHOLESTEROL, DIRECT: Direct LDL: 90 mg/dL

## 2022-01-09 MED ORDER — ATORVASTATIN CALCIUM 80 MG PO TABS: 80.0000 mg | ORAL_TABLET | Freq: Every day | ORAL | 1 refills | Status: DC

## 2022-01-09 MED ORDER — VITAMIN D (ERGOCALCIFEROL) 1.25 MG (50000 UNIT) PO CAPS: 50000.0000 [IU] | ORAL_CAPSULE | ORAL | 0 refills | Status: AC

## 2022-01-25 ENCOUNTER — Other Ambulatory Visit: Payer: Self-pay | Admitting: Internal Medicine

## 2022-01-25 DIAGNOSIS — I10 Essential (primary) hypertension: Secondary | ICD-10-CM

## 2022-03-30 ENCOUNTER — Other Ambulatory Visit: Payer: Self-pay | Admitting: Internal Medicine

## 2022-03-30 ENCOUNTER — Telehealth: Payer: Self-pay | Admitting: Internal Medicine

## 2022-03-30 DIAGNOSIS — E119 Type 2 diabetes mellitus without complications: Secondary | ICD-10-CM

## 2022-03-30 MED ORDER — ONETOUCH DELICA LANCETS 33G MISC: 1 refills | Status: DC

## 2022-03-30 MED ORDER — ONETOUCH ULTRA 2 W/DEVICE KIT: PACK | 1 refills | Status: DC

## 2022-03-30 MED ORDER — ONETOUCH VERIO VI STRP: ORAL_STRIP | 1 refills | Status: DC

## 2022-03-30 NOTE — Telephone Encounter (Signed)
Pt needs new rx one touch ultra test kit with one touch verio test strips and one touch delica lancets please send to  [facility], [place] AT [address] Phone:  [phone]  Fax:  [phone]

## 2022-03-30 NOTE — Telephone Encounter (Signed)
Rx done. 

## 2022-04-10 ENCOUNTER — Encounter: Payer: Self-pay | Admitting: Internal Medicine

## 2022-04-10 ENCOUNTER — Ambulatory Visit (INDEPENDENT_AMBULATORY_CARE_PROVIDER_SITE_OTHER): Payer: Commercial Managed Care - HMO | Admitting: Internal Medicine

## 2022-04-10 VITALS — BP 102/79 | HR 67 | Temp 97.7°F | Wt 177.7 lb

## 2022-04-10 DIAGNOSIS — E785 Hyperlipidemia, unspecified: Secondary | ICD-10-CM

## 2022-04-10 DIAGNOSIS — E559 Vitamin D deficiency, unspecified: Secondary | ICD-10-CM

## 2022-04-10 DIAGNOSIS — E119 Type 2 diabetes mellitus without complications: Secondary | ICD-10-CM

## 2022-04-10 DIAGNOSIS — I10 Essential (primary) hypertension: Secondary | ICD-10-CM | POA: Diagnosis not present

## 2022-04-10 DIAGNOSIS — Z23 Encounter for immunization: Secondary | ICD-10-CM | POA: Diagnosis not present

## 2022-04-10 DIAGNOSIS — E1129 Type 2 diabetes mellitus with other diabetic kidney complication: Secondary | ICD-10-CM | POA: Diagnosis not present

## 2022-04-10 DIAGNOSIS — R809 Proteinuria, unspecified: Secondary | ICD-10-CM

## 2022-04-10 LAB — POCT GLYCOSYLATED HEMOGLOBIN (HGB A1C): Hemoglobin A1C: 7 % — AB (ref 4.0–5.6)

## 2022-04-10 LAB — COMPREHENSIVE METABOLIC PANEL
ALT: 25 U/L (ref 0–53)
AST: 16 U/L (ref 0–37)
Albumin: 4.5 g/dL (ref 3.5–5.2)
Alkaline Phosphatase: 69 U/L (ref 39–117)
BUN: 14 mg/dL (ref 6–23)
CO2: 25 mEq/L (ref 19–32)
Calcium: 9.6 mg/dL (ref 8.4–10.5)
Chloride: 101 mEq/L (ref 96–112)
Creatinine, Ser: 0.66 mg/dL (ref 0.40–1.50)
GFR: 111.97 mL/min (ref >60.00)
Glucose, Bld: 157 mg/dL — ABNORMAL HIGH (ref 70–99)
Potassium: 4.1 mEq/L (ref 3.5–5.1)
Sodium: 136 mEq/L (ref 135–145)
Total Bilirubin: 1 mg/dL (ref 0.2–1.2)
Total Protein: 7.3 g/dL (ref 6.0–8.3)

## 2022-04-10 LAB — LIPID PANEL
Cholesterol: 161 mg/dL (ref 0–200)
HDL: 49.9 mg/dL (ref >39.00)
LDL Cholesterol: 80 mg/dL (ref 0–99)
NonHDL: 110.87
Total CHOL/HDL Ratio: 3
Triglycerides: 153 mg/dL — ABNORMAL HIGH (ref 0.0–149.0)
VLDL: 30.6 mg/dL (ref 0.0–40.0)

## 2022-04-10 LAB — VITAMIN D 25 HYDROXY (VIT D DEFICIENCY, FRACTURES): VITD: 46.98 ng/mL (ref 30.00–100.00)

## 2022-04-10 NOTE — Progress Notes (Signed)
Established Patient Office Visit     CC/Reason for Visit: 59-monthfollow-up chronic medical conditions  HPI: Christopher Hewinsis a 46y.o. male who is coming in today for the above mentioned reasons. Past Medical History is significant for: Hypertension, hyperlipidemia, type 2 diabetes, vitamin D deficiency.  He has been feeling well.  He has no acute concerns, is requesting flu vaccination.   Past Medical/Surgical History: Past Medical History:  Diagnosis Date   Diabetes mellitus without complication ([place]    Hyperlipidemia    Hypertension    Seasonal allergies    Ventral hernia     No past surgical history on file.  Social History:  reports that he has never smoked. He has never used smokeless tobacco. He reports current alcohol use. He reports that he does not use drugs.  Allergies: No Active Allergies  Family History:  Family History  Problem Relation Age of Onset   Diabetes Mother    Diabetes Father      Current Outpatient Medications:    ASPIRIN LOW DOSE 81 MG EC tablet, TAKE 1 TABLET BY MOUTH DAILY, Disp: 90 tablet, Rfl: 2   atorvastatin (LIPITOR) 80 MG tablet, Take 1 tablet (80 mg total) by mouth daily., Disp: 90 tablet, Rfl: 1   blood glucose meter kit and supplies, Dispense based on patient and insurance preference. Use up to four times daily as directed. (FOR ICD-10 E10.9, E11.9)., Disp: 1 each, Rfl: 0   Blood Glucose Monitoring Suppl (ONE TOUCH ULTRA 2) w/Device KIT, Use as directed, Disp: 1 kit, Rfl: 1   lisinopril-hydrochlorothiazide (ZESTORETIC) 20-25 MG tablet, TAKE 1 TABLET BY MOUTH DAILY, Disp: 90 tablet, Rfl: 1   metFORMIN (GLUCOPHAGE) 1000 MG tablet, TAKE 1 TABLET(1000 MG) BY MOUTH TWICE DAILY WITH A MEAL, Disp: 180 tablet, Rfl: 1   OneTouch Delica Lancets 317BMISC, Use as directed once a day, Disp: 100 each, Rfl: 1  Review of Systems:  Constitutional: Denies fever, chills, diaphoresis, appetite change and fatigue.  HEENT: Denies  photophobia, eye pain, redness, hearing loss, ear pain, congestion, sore throat, rhinorrhea, sneezing, mouth sores, trouble swallowing, neck pain, neck stiffness and tinnitus.   Respiratory: Denies SOB, DOE, cough, chest tightness,  and wheezing.   Cardiovascular: Denies chest pain, palpitations and leg swelling.  Gastrointestinal: Denies nausea, vomiting, abdominal pain, diarrhea, constipation, blood in stool and abdominal distention.  Genitourinary: Denies dysuria, urgency, frequency, hematuria, flank pain and difficulty urinating.  Endocrine: Denies: hot or cold intolerance, sweats, changes in hair or nails, polyuria, polydipsia. Musculoskeletal: Denies myalgias, back pain, joint swelling, arthralgias and gait problem.  Skin: Denies pallor, rash and wound.  Neurological: Denies dizziness, seizures, syncope, weakness, light-headedness, numbness and headaches.  Hematological: Denies adenopathy. Easy bruising, personal or family bleeding history  Psychiatric/Behavioral: Denies suicidal ideation, mood changes, confusion, nervousness, sleep disturbance and agitation    Physical Exam: Vitals:   04/10/22 0739 04/10/22 0814  BP: 102/80 102/79  Pulse: 67   Temp: 97.7 F (36.5 C)   TempSrc: Oral   SpO2: 98%   Weight: 177 lb 11.2 oz (80.6 kg)     Body mass index is 25.87 kg/m.   Constitutional: NAD, calm, comfortable Eyes: PERRL, lids and conjunctivae normal ENMT: Mucous membranes are moist. Respiratory: clear to auscultation bilaterally, no wheezing, no crackles. Normal respiratory effort. No accessory muscle use.  Cardiovascular: Regular rate and rhythm, no murmurs / rubs / gallops. No extremity edema. Psychiatric: Normal judgment and insight. Alert and oriented x 3.  Normal mood.   Diabetic Foot Exam - Simple   Simple Foot Form Diabetic Foot exam was performed with the following findings: Yes 04/10/2022  8:13 AM  Visual Inspection No deformities, no ulcerations, no other skin  breakdown bilaterally: Yes Sensation Testing Intact to touch and monofilament testing bilaterally: Yes Pulse Check Posterior Tibialis and Dorsalis pulse intact bilaterally: Yes Comments       Impression and Plan:  Type 2 diabetes mellitus without complication, without long-term current use of insulin ([place]) - Plan: POCT glycosylated hemoglobin (Hb A1C), Comprehensive metabolic panel  Microalbuminuria due to type 2 diabetes mellitus ([place])  Primary hypertension  Hyperlipidemia, unspecified hyperlipidemia type - Plan: Lipid panel  Vitamin D deficiency - Plan: VITAMIN D 25 Hydroxy (Vit-D Deficiency, Fractures)  Need for influenza vaccination  -Flu vaccine administered in office today. -A1c of 7.0 shows improved diabetic control, he will continue metformin at thousand twice daily and lifestyle changes.  Congratulated on weight loss success thus far. -Diabetic foot exam done today. -Vitamin D levels to be checked today. -In July his atorvastatin was increased to 80 mg daily in response to an LDL of 124, recheck lipids today.  Time spent:32 minutes reviewing chart, interviewing and examining patient and formulating plan of care.     Lelon Frohlich, MD [facility] at [facility]

## 2022-04-11 ENCOUNTER — Other Ambulatory Visit: Payer: Self-pay | Admitting: Internal Medicine

## 2022-04-11 DIAGNOSIS — E782 Mixed hyperlipidemia: Secondary | ICD-10-CM

## 2022-04-11 MED ORDER — EZETIMIBE 10 MG PO TABS: 10.0000 mg | ORAL_TABLET | Freq: Every day | ORAL | 1 refills | Status: DC

## 2022-04-12 ENCOUNTER — Telehealth: Payer: Self-pay | Admitting: Internal Medicine

## 2022-04-12 NOTE — Telephone Encounter (Signed)
Pt called to return call  °

## 2022-04-17 ENCOUNTER — Other Ambulatory Visit: Payer: Self-pay | Admitting: *Deleted

## 2022-04-17 DIAGNOSIS — E785 Hyperlipidemia, unspecified: Secondary | ICD-10-CM

## 2022-04-17 NOTE — Telephone Encounter (Signed)
Patient is aware of lab results. See lab result note. 

## 2022-05-31 ENCOUNTER — Telehealth: Payer: Self-pay | Admitting: Internal Medicine

## 2022-05-31 MED ORDER — ONETOUCH ULTRA VI STRP: ORAL_STRIP | 12 refills | Status: DC

## 2022-05-31 NOTE — Telephone Encounter (Signed)
Pt is calling and need a new rx (ONE TOUCH ULTRA 2 testing strip  [id number] - [place], [place] - [address] AT [facility] OF [address] Phone: [phone]  Fax: [phone]

## 2022-05-31 NOTE — Telephone Encounter (Signed)
Refill sent.

## 2022-06-05 ENCOUNTER — Other Ambulatory Visit: Payer: Self-pay | Admitting: *Deleted

## 2022-06-05 MED ORDER — ONETOUCH ULTRA VI STRP: 1.0000 | ORAL_STRIP | Freq: Every day | 12 refills | Status: DC

## 2022-06-28 ENCOUNTER — Other Ambulatory Visit: Payer: Self-pay | Admitting: Internal Medicine

## 2022-06-28 DIAGNOSIS — E785 Hyperlipidemia, unspecified: Secondary | ICD-10-CM

## 2022-07-10 ENCOUNTER — Ambulatory Visit: Payer: Commercial Managed Care - HMO | Admitting: Internal Medicine

## 2022-07-17 ENCOUNTER — Ambulatory Visit: Payer: Commercial Managed Care - HMO | Admitting: Internal Medicine

## 2022-07-23 ENCOUNTER — Other Ambulatory Visit: Payer: Commercial Managed Care - HMO

## 2022-08-02 ENCOUNTER — Telehealth: Payer: Self-pay | Admitting: Internal Medicine

## 2022-08-02 DIAGNOSIS — I10 Essential (primary) hypertension: Secondary | ICD-10-CM

## 2022-08-02 NOTE — Telephone Encounter (Signed)
Prescription Request  08/02/2022  Is this a "Controlled Substance" medicine? No  LOV: 04/10/2022  What is the name of the medication or equipment? lisinopril  Have you contacted your pharmacy to request a refill? Yes   Which pharmacy would you like this sent to?  [facility]  Patient notified that their request is being sent to the clinical staff for review and that they should receive a response within 2 business days.   Please advise at Mobile [phone] (mobile)

## 2022-08-03 MED ORDER — LISINOPRIL-HYDROCHLOROTHIAZIDE 20-25 MG PO TABS: 1.0000 | ORAL_TABLET | Freq: Every day | ORAL | 1 refills | Status: DC

## 2022-08-03 NOTE — Telephone Encounter (Signed)
Rx done. 

## 2022-08-03 NOTE — Telephone Encounter (Signed)
Patient at pharmacy, pharmacy calling to check on progress of this refill

## 2022-08-22 ENCOUNTER — Other Ambulatory Visit (INDEPENDENT_AMBULATORY_CARE_PROVIDER_SITE_OTHER): Payer: Self-pay

## 2022-08-22 DIAGNOSIS — E785 Hyperlipidemia, unspecified: Secondary | ICD-10-CM

## 2022-08-22 LAB — LIPID PANEL
Cholesterol: 152 mg/dL (ref 0–200)
HDL: 49.3 mg/dL (ref >39.00)
NonHDL: 102.88
Total CHOL/HDL Ratio: 3
Triglycerides: 218 mg/dL — ABNORMAL HIGH (ref 0.0–149.0)
VLDL: 43.6 mg/dL — ABNORMAL HIGH (ref 0.0–40.0)

## 2022-08-22 LAB — LDL CHOLESTEROL, DIRECT: Direct LDL: 61 mg/dL

## 2022-08-29 ENCOUNTER — Encounter: Payer: Self-pay | Admitting: Internal Medicine

## 2022-08-29 ENCOUNTER — Ambulatory Visit (INDEPENDENT_AMBULATORY_CARE_PROVIDER_SITE_OTHER): Payer: 59 | Admitting: Internal Medicine

## 2022-08-29 VITALS — BP 133/79 | HR 73 | Temp 97.7°F | Wt 182.5 lb

## 2022-08-29 DIAGNOSIS — E119 Type 2 diabetes mellitus without complications: Secondary | ICD-10-CM

## 2022-08-29 DIAGNOSIS — I10 Essential (primary) hypertension: Secondary | ICD-10-CM

## 2022-08-29 DIAGNOSIS — E785 Hyperlipidemia, unspecified: Secondary | ICD-10-CM

## 2022-08-29 DIAGNOSIS — R1032 Left lower quadrant pain: Secondary | ICD-10-CM

## 2022-08-29 DIAGNOSIS — E782 Mixed hyperlipidemia: Secondary | ICD-10-CM

## 2022-08-29 DIAGNOSIS — Z1211 Encounter for screening for malignant neoplasm of colon: Secondary | ICD-10-CM

## 2022-08-29 LAB — POCT GLYCOSYLATED HEMOGLOBIN (HGB A1C): Hemoglobin A1C: 8.2 % — AB (ref 4.0–5.6)

## 2022-08-29 MED ORDER — ATORVASTATIN CALCIUM 80 MG PO TABS: 80.0000 mg | ORAL_TABLET | Freq: Every day | ORAL | 1 refills | Status: DC

## 2022-08-29 MED ORDER — EZETIMIBE 10 MG PO TABS: 10.0000 mg | ORAL_TABLET | Freq: Every day | ORAL | 1 refills | Status: DC

## 2022-08-29 NOTE — Progress Notes (Signed)
Established Patient Office Visit     CC/Reason for Visit: Follow-up chronic conditions and discuss acute concern  HPI: Christopher Hurley is a 47 y.o. male who is coming in today for the above mentioned reasons. Past Medical History is significant for: Hypertension, hyperlipidemia, type 2 diabetes and vitamin D deficiency.  He has not been taking ezetimibe that was recently prescribed.  He admits to multiple dietary indiscretions, has gained about 5 pounds since last visit.  He has been eating nuts and raw vegetables and lots of beans.  Last week he developed left lower quadrant pain.  He has had diverticulitis in the past.  He believes this is the same.  Pain is already improved today, no vomiting or nausea.   Past Medical/Surgical History: Past Medical History:  Diagnosis Date   Diabetes mellitus without complication ([place])    Hyperlipidemia    Hypertension    Seasonal allergies    Ventral hernia     No past surgical history on file.  Social History:  reports that he has never smoked. He has never used smokeless tobacco. He reports current alcohol use. He reports that he does not use drugs.  Allergies: No Active Allergies  Family History:  Family History  Problem Relation Age of Onset   Diabetes Mother    Diabetes Father      Current Outpatient Medications:    ASPIRIN LOW DOSE 81 MG EC tablet, TAKE 1 TABLET BY MOUTH DAILY, Disp: 90 tablet, Rfl: 2   blood glucose meter kit and supplies, Dispense based on patient and insurance preference. Use up to four times daily as directed. (FOR ICD-10 E10.9, E11.9)., Disp: 1 each, Rfl: 0   Blood Glucose Monitoring Suppl (ONE TOUCH ULTRA 2) w/Device KIT, Use as directed, Disp: 1 kit, Rfl: 1   glucose blood (ONETOUCH ULTRA) test strip, 1 each by Other route daily. Dx E11.9, Disp: 100 each, Rfl: 12   lisinopril-hydrochlorothiazide (ZESTORETIC) 20-25 MG tablet, Take 1 tablet by mouth daily., Disp: 90 tablet, Rfl: 1   metFORMIN  (GLUCOPHAGE) 1000 MG tablet, TAKE 1 TABLET(1000 MG) BY MOUTH TWICE DAILY WITH A MEAL, Disp: 180 tablet, Rfl: 1   OneTouch Delica Lancets 99991111 MISC, Use as directed once a day, Disp: 100 each, Rfl: 1   atorvastatin (LIPITOR) 80 MG tablet, Take 1 tablet (80 mg total) by mouth daily., Disp: 90 tablet, Rfl: 1   ezetimibe (ZETIA) 10 MG tablet, Take 1 tablet (10 mg total) by mouth daily., Disp: 90 tablet, Rfl: 1  Review of Systems:  Negative unless indicated in HPI.   Physical Exam: Vitals:   08/29/22 0829  BP: 133/79  Pulse: 73  Temp: 97.7 F (36.5 C)  TempSrc: Oral  SpO2: 98%  Weight: 182 lb 8 oz (82.8 kg)    Body mass index is 26.56 kg/m.   Physical Exam Vitals reviewed.  Constitutional:      Appearance: Normal appearance.  HENT:     Head: Normocephalic and atraumatic.  Eyes:     Conjunctiva/sclera: Conjunctivae normal.     Pupils: Pupils are equal, round, and reactive to light.  Cardiovascular:     Rate and Rhythm: Normal rate and regular rhythm.  Pulmonary:     Effort: Pulmonary effort is normal.     Breath sounds: Normal breath sounds.  Abdominal:     Tenderness: There is abdominal tenderness in the left lower quadrant.  Skin:    General: Skin is warm and dry.  Neurological:  General: No focal deficit present.     Mental Status: He is alert and oriented to person, place, and time.  Psychiatric:        Mood and Affect: Mood normal.        Behavior: Behavior normal.        Thought Content: Thought content normal.        Judgment: Judgment normal.      Impression and Plan:  Type 2 diabetes mellitus without complication, without long-term current use of insulin ([facility]) - Plan: POC HgB A1c  Primary hypertension  Hyperlipidemia, unspecified hyperlipidemia type - Plan: atorvastatin (LIPITOR) 80 MG tablet  LLQ pain  Mixed hyperlipidemia - Plan: ezetimibe (ZETIA) 10 MG tablet  -A1c has worsened to 8.2.  We have discussed enforcing lifestyle changes.  Return in  3 months for follow-up, if no improvement consider addition of GLP-1. -He has not been taking ezetimibe.  This was added due to a suboptimal LDL on max dose atorvastatin.  We have discussed importance of adherence, he will resume taking both.  Recheck lipids in 3 months. -He is overdue for screening colonoscopy, I will send referral. -Blood pressure is fairly well-controlled on lisinopril/hydrochlorothiazide. -Suspect he has low-grade diverticulitis based on symptoms.  It is already improving.  See no need for antibiotics.  We have discussed foods important to avoid while healing.  Time spent:34 minutes reviewing chart, interviewing and examining patient and formulating plan of care.     Lelon Frohlich, MD [facility] at [facility]

## 2022-08-29 NOTE — Addendum Note (Signed)
Addended by: Westley Hummer B on: 08/29/2022 09:13 AM   Modules accepted: Orders

## 2022-10-10 LAB — HM DIABETES EYE EXAM

## 2022-10-11 ENCOUNTER — Telehealth: Payer: Self-pay

## 2022-10-11 NOTE — Telephone Encounter (Signed)
PA request received via CMM for Ezetimibe 10MG  tablets  PA has been submitted to [employer] and is pending additional questions/determination  Key: [id number]  *added to Atorvastatin for additional benefit

## 2022-10-15 NOTE — Telephone Encounter (Signed)
Prior Authorization for Ezetimibe has been approved 10/12/22 - 10/12/23.

## 2022-10-18 ENCOUNTER — Encounter (INDEPENDENT_AMBULATORY_CARE_PROVIDER_SITE_OTHER): Payer: 59 | Admitting: Ophthalmology

## 2022-10-18 DIAGNOSIS — H353132 Nonexudative age-related macular degeneration, bilateral, intermediate dry stage: Secondary | ICD-10-CM | POA: Diagnosis not present

## 2022-10-18 DIAGNOSIS — H43813 Vitreous degeneration, bilateral: Secondary | ICD-10-CM

## 2022-10-18 DIAGNOSIS — I10 Essential (primary) hypertension: Secondary | ICD-10-CM | POA: Diagnosis not present

## 2022-10-18 DIAGNOSIS — H35033 Hypertensive retinopathy, bilateral: Secondary | ICD-10-CM | POA: Diagnosis not present

## 2022-10-24 ENCOUNTER — Ambulatory Visit (AMBULATORY_SURGERY_CENTER): Payer: 59

## 2022-10-24 VITALS — Ht 69.5 in | Wt 192.0 lb

## 2022-10-24 DIAGNOSIS — Z1211 Encounter for screening for malignant neoplasm of colon: Secondary | ICD-10-CM

## 2022-10-24 MED ORDER — NA SULFATE-K SULFATE-MG SULF 17.5-3.13-1.6 GM/177ML PO SOLN: 1.0000 | Freq: Once | ORAL | 0 refills | Status: AC

## 2022-10-24 NOTE — Progress Notes (Signed)
Pre visit completed via phone call; Patient verified name, DOB, and address; Patient reports weight =192 lbs during PV appt; No egg or soy allergy known to patient; No issues known to pt with past sedation with any surgeries or procedures; Patient denies ever being told they had issues or difficulty with intubation;  No FH of Malignant Hyperthermia; Pt is not on diet pills; Pt is not on home 02;  Pt is not on blood thinners;  Pt denies issues with constipation;  No A fib or A flutter; Have any cardiac testing pending--NO Pt instructed to use Singlecare.com or GoodRx for a price reduction on prep;   Insurance verified during PV appt=[employer]  Patient's chart reviewed by Cathlyn Parsons CNRA prior to previsit and patient appropriate for the [facility].  Previsit completed and red dot placed by patient's name on their procedure day (on provider's schedule).    Instructions printed as well as a [facility] coupon [facility] for the patient to pick up from the office on 10/25/2022 or 10/26/2022; patient advised that picking up instructions is important for him to be able to complete the prep prior to his procedure;  Patient verbalized understanding of information/instructions;     PV was lengthy due to language barrier; for future visit patient needs to have a spanish interpreter present;

## 2022-10-25 ENCOUNTER — Encounter: Payer: Self-pay | Admitting: Gastroenterology

## 2022-11-02 ENCOUNTER — Encounter: Payer: Self-pay | Admitting: Gastroenterology

## 2022-11-02 ENCOUNTER — Ambulatory Visit (AMBULATORY_SURGERY_CENTER): Payer: 59 | Admitting: Gastroenterology

## 2022-11-02 VITALS — BP 121/82 | HR 62 | Temp 97.8°F | Resp 13 | Ht 69.5 in | Wt 192.0 lb

## 2022-11-02 DIAGNOSIS — D125 Benign neoplasm of sigmoid colon: Secondary | ICD-10-CM

## 2022-11-02 DIAGNOSIS — K5730 Diverticulosis of large intestine without perforation or abscess without bleeding: Secondary | ICD-10-CM

## 2022-11-02 DIAGNOSIS — Z1211 Encounter for screening for malignant neoplasm of colon: Secondary | ICD-10-CM

## 2022-11-02 DIAGNOSIS — K635 Polyp of colon: Secondary | ICD-10-CM | POA: Diagnosis not present

## 2022-11-02 MED ORDER — SODIUM CHLORIDE 0.9 % IV SOLN: 500.0000 mL | Freq: Once | INTRAVENOUS | Status: DC

## 2022-11-02 NOTE — Progress Notes (Signed)
GASTROENTEROLOGY PROCEDURE H&P NOTE   Primary Care Physician: Philip Aspen, Limmie Patricia, MD    Reason for Procedure:  Colon Cancer screening  Plan:    Colonoscopy  Patient is appropriate for endoscopic procedure(s) in the ambulatory ([facility]) setting.  The nature of the procedure, as well as the risks, benefits, and alternatives were carefully and thoroughly reviewed with the patient. Ample time for discussion and questions allowed. The patient understood, was satisfied, and agreed to proceed.     HPI: Christopher Hurley is a 47 y.o. male who presents for colonoscopy for routine Colon Cancer screening.  No active GI symptoms.  No known family history of colon cancer or related malignancy.  Patient is otherwise without complaints or active issues today.  Past Medical History:  Diagnosis Date   Diabetes mellitus without complication (HCC)    on meds   Hyperlipidemia    on meds   Hypertension    on meds   Seasonal allergies    Ventral hernia     Past Surgical History:  Procedure Laterality Date   TIBIA FRACTURE SURGERY Right 1992    Prior to Admission medications   Medication Sig Start Date End Date Taking? Authorizing Provider  ASPIRIN LOW DOSE 81 MG EC tablet TAKE 1 TABLET BY MOUTH DAILY 05/25/21  Yes Philip Aspen, Limmie Patricia, MD  atorvastatin (LIPITOR) 80 MG tablet Take 1 tablet (80 mg total) by mouth daily. 08/29/22  Yes Henderson Cloud, MD  blood glucose meter kit and supplies Dispense based on patient and insurance preference. Use up to four times daily as directed. (FOR ICD-10 E10.9, E11.9). 01/08/22  Yes Philip Aspen, Limmie Patricia, MD  Blood Glucose Monitoring Suppl (ONE TOUCH ULTRA 2) w/Device KIT Use as directed 03/30/22  Yes Philip Aspen, Limmie Patricia, MD  glucose blood (ONETOUCH ULTRA) test strip 1 each by Other route daily. Dx E11.9 06/05/22  Yes Philip Aspen, Limmie Patricia, MD  lisinopril-hydrochlorothiazide (ZESTORETIC) 20-25 MG tablet Take 1  tablet by mouth daily. 08/03/22  Yes Philip Aspen, Limmie Patricia, MD  metFORMIN (GLUCOPHAGE) 1000 MG tablet TAKE 1 TABLET(1000 MG) BY MOUTH TWICE DAILY WITH A MEAL 04/02/22  Yes Philip Aspen, Limmie Patricia, MD  Multiple Vitamin (MULTIVITAMIN) capsule Take 1 capsule by mouth daily.   Yes [provider]  Multiple Vitamins-Minerals (PRESERVISION AREDS PO) Take 1 tablet by mouth daily at 6 (six) AM.   Yes [provider]  OneTouch Delica Lancets 33G MISC Use as directed once a day 03/30/22  Yes Philip Aspen, Limmie Patricia, MD  ezetimibe (ZETIA) 10 MG tablet Take 1 tablet (10 mg total) by mouth daily. Patient not taking: Reported on 11/02/2022 08/29/22   Philip Aspen, Limmie Patricia, MD    Current Outpatient Medications  Medication Sig Dispense Refill   ASPIRIN LOW DOSE 81 MG EC tablet TAKE 1 TABLET BY MOUTH DAILY 90 tablet 2   atorvastatin (LIPITOR) 80 MG tablet Take 1 tablet (80 mg total) by mouth daily. 90 tablet 1   blood glucose meter kit and supplies Dispense based on patient and insurance preference. Use up to four times daily as directed. (FOR ICD-10 E10.9, E11.9). 1 each 0   Blood Glucose Monitoring Suppl (ONE TOUCH ULTRA 2) w/Device KIT Use as directed 1 kit 1   glucose blood (ONETOUCH ULTRA) test strip 1 each by Other route daily. Dx E11.9 100 each 12   lisinopril-hydrochlorothiazide (ZESTORETIC) 20-25 MG tablet Take 1 tablet by mouth daily. 90 tablet 1  metFORMIN (GLUCOPHAGE) 1000 MG tablet TAKE 1 TABLET(1000 MG) BY MOUTH TWICE DAILY WITH A MEAL 180 tablet 1   Multiple Vitamin (MULTIVITAMIN) capsule Take 1 capsule by mouth daily.     Multiple Vitamins-Minerals (PRESERVISION AREDS PO) Take 1 tablet by mouth daily at 6 (six) AM.     OneTouch Delica Lancets 33G MISC Use as directed once a day 100 each 1   ezetimibe (ZETIA) 10 MG tablet Take 1 tablet (10 mg total) by mouth daily. (Patient not taking: Reported on 11/02/2022) 90 tablet 1   Current Facility-Administered Medications   Medication Dose Route Frequency Provider Last Rate Last Admin   0.9 %  sodium chloride infusion  500 mL Intravenous Once Aibhlinn Kalmar V, DO        Allergies as of 11/02/2022 - Review Complete 11/02/2022  Allergen Reaction Noted   Atorvastatin Nausea And Vomiting 02/12/2019    Family History  Problem Relation Age of Onset   Diabetes Mother    Diabetes Father    Colon cancer Neg Hx    Colon polyps Neg Hx    Esophageal cancer Neg Hx    Rectal cancer Neg Hx    Stomach cancer Neg Hx     Social History   Socioeconomic History   Marital status: Married    Spouse name: Not on file   Number of children: Not on file   Years of education: Not on file   Highest education level: Not on file  Occupational History   Not on file  Tobacco Use   Smoking status: Never   Smokeless tobacco: Never  Vaping Use   Vaping Use: Never used  Substance and Sexual Activity   Alcohol use: Yes    Alcohol/week: 7.0 standard drinks of alcohol    Types: 7 Cans of beer per week    Comment: occasional   Drug use: Never   Sexual activity: Not on file  Other Topics Concern   Not on file  Social History Narrative   Not on file   Social Determinants of Health   Financial Resource Strain: Not on file  Food Insecurity: Not on file  Transportation Needs: Not on file  Physical Activity: Not on file  Stress: Not on file  Social Connections: Not on file  Intimate Partner Violence: Not on file    Physical Exam: Vital signs in last 24 hours: @BP  136/89   Pulse 81   Temp 97.8 F (36.6 C)   Resp 16   Ht 5' 9.5" (1.765 m)   Wt 192 lb (87.1 kg)   SpO2 100%   BMI 27.95 kg/m  GEN: NAD EYE: Sclerae anicteric ENT: MMM CV: Non-tachycardic Pulm: CTA b/l GI: Soft, NT/ND NEURO:  Alert & Oriented x 3   Doristine Locks, DO [facility]   11/02/2022 3:50 PM

## 2022-11-02 NOTE — Progress Notes (Signed)
Pt's states no medical or surgical changes since previsit or office visit. 

## 2022-11-02 NOTE — Patient Instructions (Addendum)
Handouts Provided:  Polyps and Diverticulosis  YOU HAD AN ENDOSCOPIC PROCEDURE TODAY AT THE [facility]:   Refer to the procedure report that was given to you for any specific questions about what was found during the examination.  If the procedure report does not answer your questions, please call your gastroenterologist to clarify.  If you requested that your care partner not be given the details of your procedure findings, then the procedure report has been included in a sealed envelope for you to review at your convenience later.  YOU SHOULD EXPECT: Some feelings of bloating in the abdomen. Passage of more gas than usual.  Walking can help get rid of the air that was put into your GI tract during the procedure and reduce the bloating. If you had a lower endoscopy (such as a colonoscopy or flexible sigmoidoscopy) you may notice spotting of blood in your stool or on the toilet paper. If you underwent a bowel prep for your procedure, you may not have a normal bowel movement for a few days.  Please Note:  You might notice some irritation and congestion in your nose or some drainage.  This is from the oxygen used during your procedure.  There is no need for concern and it should clear up in a day or so.  SYMPTOMS TO REPORT IMMEDIATELY:  Following lower endoscopy (colonoscopy or flexible sigmoidoscopy):  Excessive amounts of blood in the stool  Significant tenderness or worsening of abdominal pains  Swelling of the abdomen that is new, acute  Fever of 100F or higher  For urgent or emergent issues, a gastroenterologist can be reached at any hour by calling [phone]. Do not use MyChart messaging for urgent concerns.    DIET:  We do recommend a small meal at first, but then you may proceed to your regular diet.  Drink plenty of fluids but you should avoid alcoholic beverages for 24 hours.  ACTIVITY:  You should plan to take it easy for the rest of today and you should NOT DRIVE  or use heavy machinery until tomorrow (because of the sedation medicines used during the test).    FOLLOW UP: Our staff will call the number listed on your records the next business day following your procedure.  We will call around 7:15- 8:00 am to check on you and address any questions or concerns that you may have regarding the information given to you following your procedure. If we do not reach you, we will leave a message.     If any biopsies were taken you will be contacted by phone or by letter within the next 1-3 weeks.  Please call us at [phone] if you have not heard about the biopsies in 3 weeks.    SIGNATURES/CONFIDENTIALITY: You and/or your care partner have signed paperwork which will be entered into your electronic medical record.  These signatures attest to the fact that that the information above on your After Visit Summary has been reviewed and is understood.  Full responsibility of the confidentiality of this discharge information lies with you and/or your care-partner.  USTED TUVO UN PROCEDIMIENTO ENDOSCPICO HOY EN EL [facility]:   Lea el informe del procedimiento que se le entreg para cualquier pregunta especfica sobre lo que se Dentist.  Si el informe del examen no responde a sus preguntas, por favor llame a su gastroenterlogo para aclararlo.  Si usted solicit que no se le den [employer] de lo que se  encontr en su procedimiento al acompaante que le va a cuidar, entonces el informe del procedimiento se ha incluido en un sobre sellado para que usted lo revise despus cuando le sea ms conveniente.   LO QUE PUEDE ESPERAR: Algunas sensaciones de hinchazn en el abdomen.  Puede tener ms gases de lo normal.  El caminar puede ayudarle a eliminar el aire que se le puso en el tracto gastrointestinal durante el procedimiento y reducir la hinchazn.  Si le hicieron una endoscopia inferior (como una colonoscopia o una sigmoidoscopia flexible),  podra notar manchas de sangre en las heces fecales o en el papel higinico.  Si se someti a una preparacin intestinal para su procedimiento, es posible que no tenga una evacuacin intestinal normal durante [employer].   Tenga en cuenta:  Es posible que note un poco de irritacin y congestin en la nariz o algn drenaje.  Esto es debido al oxgeno [employer] durante su procedimiento.  No hay que preocuparse y esto debe desaparecer ms o Regulatory affairs officer.   SNTOMAS PARA REPORTAR INMEDIATAMENTE:  Despus de una endoscopia inferior (colonoscopia o sigmoidoscopia flexible):  Cantidades excesivas de sangre en las heces fecales  Sensibilidad significativa o empeoramiento de los dolores abdominales   Hinchazn aguda del abdomen que antes no tena   Fiebre de 100F o ms   Despus de la endoscopia superior (EGD)  Vmitos de Retail buyer o material como caf molido   Dolor en el pecho o dolor debajo de los omplatos que antes no tena   Dolor o dificultad persistente para tragar  Falta de aire que antes no tena   Fiebre de 100F o ms  Heces fecales negras y pegajosas   Para asuntos urgentes o de Associate Professor, puede comunicarse con un gastroenterlogo a cualquier hora llamando al [phone].  DIETA:  Recomendamos una comida pequea al principio, pero luego puede continuar con su dieta normal.  Tome muchos lquidos, Tax adviser las bebidas alcohlicas durante 24 horas.    ACTIVIDAD:  Debe planear tomarse las cosas con calma por el resto del da y no debe CONDUCIR ni usar maquinaria pesada Patent examiner (debido a los medicamentos de sedacin utilizados durante el examen).     SEGUIMIENTO: Nuestro personal llamar al nmero que aparece en su historial al siguiente da hbil de su procedimiento para ver cmo se siente y para responder cualquier pregunta o inquietud que pueda tener con respecto a la informacin que se le dio despus del procedimiento. Si no podemos contactarle, le dejaremos un  mensaje.  Sin embargo, si se siente bien y no tiene English as a second language teacher, no es necesario que nos devuelva la llamada.  Asumiremos que ha regresado a sus actividades diarias normales sin incidentes. Si se le tomaron algunas biopsias, le contactaremos por telfono o por carta en las prximas 3 semanas.  Si no ha sabido [employer] biopsias en el transcurso de 3 semanas, por favor llmenos al [phone].   FIRMAS/CONFIDENCIALIDAD: Usted y/o el acompaante que le cuide han firmado documentos que se ingresarn en su historial mdico electrnico.  Estas firmas atestiguan el hecho de que la informacin anterior

## 2022-11-02 NOTE — Op Note (Signed)
[facility] Patient Name: Christopher Hurley Procedure Date: 11/02/2022 3:45 PM MRN: [id number] Endoscopist: Doristine Locks , MD, [id number] Age: 47 Referring MD:  Date of Birth: [birth year] Gender: Male Account #: [id number] Procedure:                Colonoscopy Indications:              Screening for colorectal malignant neoplasm, This                            is the patient's first colonoscopy Medicines:                Monitored Anesthesia Care Procedure:                Pre-Anesthesia Assessment:                           - Prior to the procedure, a History and Physical                            was performed, and patient medications and                            allergies were reviewed. The patient's tolerance of                            previous anesthesia was also reviewed. The risks                            and benefits of the procedure and the sedation                            options and risks were discussed with the patient.                            All questions were answered, and informed consent                            was obtained. Prior Anticoagulants: The patient has                            taken no anticoagulant or antiplatelet agents. ASA                            Grade Assessment: II - A patient with mild systemic                            disease. After reviewing the risks and benefits,                            the patient was deemed in satisfactory condition to                            undergo the procedure.  After obtaining informed consent, the colonoscope                            was passed under direct vision. Throughout the                            procedure, the patient's blood pressure, pulse, and                            oxygen saturations were monitored continuously. The                            CF [id number] #[id number] was introduced through the anus                            and advanced to the the  terminal ileum. The                            colonoscopy was performed without difficulty. The                            patient tolerated the procedure well. The quality                            of the bowel preparation was good. The terminal                            ileum, ileocecal valve, appendiceal orifice, and                            rectum were photographed. Scope In: 3:57:38 PM Scope Out: 4:12:25 PM Scope Withdrawal Time: 0 hours 11 minutes 55 seconds  Total Procedure Duration: 0 hours 14 minutes 47 seconds  Findings:                 The perianal and digital rectal examinations were                            normal.                           Three sessile polyps were found in the sigmoid                            colon. The polyps were 2 to 5 mm in size. These                            polyps were removed with a cold snare. Resection                            and retrieval were complete. Estimated blood loss                            was minimal.  A few small-mouthed diverticula were found in the                            sigmoid colon.                           The retroflexed view of the distal rectum and anal                            verge was normal and showed no anal or rectal                            abnormalities.                           The terminal ileum appeared normal. Complications:            No immediate complications. Estimated Blood Loss:     Estimated blood loss was minimal. Impression:               - Three 2 to 5 mm polyps in the sigmoid colon,                            removed with a cold snare. Resected and retrieved.                           - Diverticulosis in the sigmoid colon.                           - The distal rectum and anal verge are normal on                            retroflexion view.                           - The examined portion of the ileum was normal.                           - The GI  Genius (intelligent endoscopy module),                            computer-aided polyp detection system powered by AI                            was utilized to detect colorectal polyps through                            enhanced visualization during colonoscopy. Recommendation:           - Patient has a contact number available for                            emergencies. The signs and symptoms of potential  delayed complications were discussed with the                            patient. Return to normal activities tomorrow.                            Written discharge instructions were provided to the                            patient.                           - Resume previous diet.                           - Continue present medications.                           - Await pathology results.                           - Repeat colonoscopy for surveillance based on                            pathology results.                           - Return to GI office PRN. Doristine Locks, MD 11/02/2022 4:20:01 PM

## 2022-11-02 NOTE — Progress Notes (Signed)
Uneventful anesthetic. Report to pacu rn. Vss. Care resumed by rn. 

## 2022-11-06 ENCOUNTER — Telehealth: Payer: Self-pay | Admitting: *Deleted

## 2022-11-06 NOTE — Telephone Encounter (Signed)
No answer for post procedure call back. Unable to leave VM. 

## 2022-11-07 ENCOUNTER — Other Ambulatory Visit: Payer: Self-pay | Admitting: Internal Medicine

## 2022-11-07 DIAGNOSIS — E785 Hyperlipidemia, unspecified: Secondary | ICD-10-CM

## 2022-11-08 ENCOUNTER — Encounter: Payer: Self-pay | Admitting: Gastroenterology

## 2022-11-21 ENCOUNTER — Other Ambulatory Visit: Payer: Self-pay | Admitting: Internal Medicine

## 2022-11-21 DIAGNOSIS — E119 Type 2 diabetes mellitus without complications: Secondary | ICD-10-CM

## 2022-11-29 ENCOUNTER — Encounter: Payer: Self-pay | Admitting: Internal Medicine

## 2022-11-29 ENCOUNTER — Ambulatory Visit (INDEPENDENT_AMBULATORY_CARE_PROVIDER_SITE_OTHER): Payer: 59 | Admitting: Internal Medicine

## 2022-11-29 VITALS — BP 124/83 | HR 79 | Temp 97.8°F | Wt 177.6 lb

## 2022-11-29 DIAGNOSIS — E785 Hyperlipidemia, unspecified: Secondary | ICD-10-CM | POA: Diagnosis not present

## 2022-11-29 DIAGNOSIS — I10 Essential (primary) hypertension: Secondary | ICD-10-CM

## 2022-11-29 DIAGNOSIS — E1129 Type 2 diabetes mellitus with other diabetic kidney complication: Secondary | ICD-10-CM

## 2022-11-29 DIAGNOSIS — R809 Proteinuria, unspecified: Secondary | ICD-10-CM

## 2022-11-29 DIAGNOSIS — Z7985 Long-term (current) use of injectable non-insulin antidiabetic drugs: Secondary | ICD-10-CM

## 2022-11-29 DIAGNOSIS — E119 Type 2 diabetes mellitus without complications: Secondary | ICD-10-CM | POA: Diagnosis not present

## 2022-11-29 DIAGNOSIS — Z7984 Long term (current) use of oral hypoglycemic drugs: Secondary | ICD-10-CM

## 2022-11-29 DIAGNOSIS — E782 Mixed hyperlipidemia: Secondary | ICD-10-CM

## 2022-11-29 LAB — POCT GLYCOSYLATED HEMOGLOBIN (HGB A1C): Hemoglobin A1C: 9.3 % — AB (ref 4.0–5.6)

## 2022-11-29 MED ORDER — LISINOPRIL-HYDROCHLOROTHIAZIDE 20-25 MG PO TABS
1.0000 | ORAL_TABLET | Freq: Every day | ORAL | 1 refills | Status: DC
Start: 2022-11-29 — End: 2023-05-27

## 2022-11-29 MED ORDER — OZEMPIC (0.25 OR 0.5 MG/DOSE) 2 MG/3ML ~~LOC~~ SOPN
0.5000 mg | PEN_INJECTOR | SUBCUTANEOUS | 2 refills | Status: DC
Start: 2022-11-29 — End: 2023-05-27

## 2022-11-29 MED ORDER — ASPIRIN 81 MG PO TBEC: 81.0000 mg | DELAYED_RELEASE_TABLET | Freq: Every day | ORAL | 3 refills | Status: DC

## 2022-11-29 MED ORDER — ATORVASTATIN CALCIUM 80 MG PO TABS
80.0000 mg | ORAL_TABLET | Freq: Every day | ORAL | 1 refills | Status: AC
Start: 2022-11-29 — End: ?

## 2022-11-29 MED ORDER — EZETIMIBE 10 MG PO TABS: 10.0000 mg | ORAL_TABLET | Freq: Every day | ORAL | 1 refills | Status: DC

## 2022-11-29 MED ORDER — METFORMIN HCL 1000 MG PO TABS: ORAL_TABLET | ORAL | 1 refills | Status: DC

## 2022-11-29 NOTE — Assessment & Plan Note (Signed)
On lisinopril

## 2022-11-29 NOTE — Assessment & Plan Note (Signed)
Well controlled on lisinopril hydrochlorothiazide

## 2022-11-29 NOTE — Assessment & Plan Note (Signed)
Uncontrolled with an A1c that has risen to 9.3 from 8.2 at last visit in March.  He admits to significant dietary indiscretions as well as not taking metformin consistently.  He will work on lifestyle changes, we have decided to add semaglutide 0.5 mg weekly.  He will return in 3 months for follow-up.

## 2022-11-29 NOTE — Progress Notes (Signed)
Established Patient Office Visit     CC/Reason for Visit: 26-month follow-up chronic conditions  HPI: Christopher Hurley is a 47 y.o. male who is coming in today for the above mentioned reasons. Past Medical History is significant for: Hypertension, hyperlipidemia, type 2 diabetes, vitamin D deficiency.  He is feeling well.  He has lost 15 pounds since last visit.  He has had some significant dietary indiscretions as well as really admits to not taking medication consistently including metformin and ezetimibe.   Past Medical/Surgical History: Past Medical History:  Diagnosis Date   Diabetes mellitus without complication (HCC)    on meds   Hyperlipidemia    on meds   Hypertension    on meds   Seasonal allergies    Ventral hernia     Past Surgical History:  Procedure Laterality Date   TIBIA FRACTURE SURGERY Right 1992    Social History:  reports that he has never smoked. He has never used smokeless tobacco. He reports current alcohol use of about 7.0 standard drinks of alcohol per week. He reports that he does not use drugs.  Allergies: Allergies  Allergen Reactions   Atorvastatin Nausea And Vomiting    NOT ALLERGIC**Atorvastatin caused nausea / vomiting but also metformin was started at the same time for DM2 causing diarrhea x 1 week . Most likely GI side effects were due to metformin    Family History:  Family History  Problem Relation Age of Onset   Diabetes Mother    Diabetes Father    Colon cancer Neg Hx    Colon polyps Neg Hx    Esophageal cancer Neg Hx    Rectal cancer Neg Hx    Stomach cancer Neg Hx      Current Outpatient Medications:    blood glucose meter kit and supplies, Dispense based on patient and insurance preference. Use up to four times daily as directed. (FOR ICD-10 E10.9, E11.9)., Disp: 1 each, Rfl: 0   Blood Glucose Monitoring Suppl (ONE TOUCH ULTRA 2) w/Device KIT, Use as directed, Disp: 1 kit, Rfl: 1   glucose blood (ONETOUCH  ULTRA) test strip, 1 each by Other route daily. Dx E11.9, Disp: 100 each, Rfl: 12   Multiple Vitamin (MULTIVITAMIN) capsule, Take 1 capsule by mouth daily., Disp: , Rfl:    Multiple Vitamins-Minerals (PRESERVISION AREDS PO), Take 1 tablet by mouth daily at 6 (six) AM., Disp: , Rfl:    OneTouch Delica Lancets 33G MISC, Use as directed once a day, Disp: 100 each, Rfl: 1   Semaglutide,0.25 or 0.5MG /DOS, (OZEMPIC, 0.25 OR 0.5 MG/DOSE,) 2 MG/3ML SOPN, Inject 0.5 mg into the skin once a week., Disp: 3 mL, Rfl: 2   aspirin EC (ASPIRIN LOW DOSE) 81 MG tablet, Take 1 tablet (81 mg total) by mouth daily. Swallow whole., Disp: 90 tablet, Rfl: 3   atorvastatin (LIPITOR) 80 MG tablet, Take 1 tablet (80 mg total) by mouth daily., Disp: 90 tablet, Rfl: 1   ezetimibe (ZETIA) 10 MG tablet, Take 1 tablet (10 mg total) by mouth daily., Disp: 90 tablet, Rfl: 1   lisinopril-hydrochlorothiazide (ZESTORETIC) 20-25 MG tablet, Take 1 tablet by mouth daily., Disp: 90 tablet, Rfl: 1   metFORMIN (GLUCOPHAGE) 1000 MG tablet, TAKE 1 TABLET BY MOUTH TWICE A DAY WITH A MEAL, Disp: 180 tablet, Rfl: 1  Review of Systems:  Negative unless indicated in HPI.   Physical Exam: Vitals:   11/29/22 0800  BP: 124/83  Pulse: 79  Temp:  97.8 F (36.6 C)  TempSrc: Oral  SpO2: 98%  Weight: 177 lb 9.6 oz (80.6 kg)    Body mass index is 25.85 kg/m.   Physical Exam Vitals reviewed.  Constitutional:      Appearance: Normal appearance.  HENT:     Head: Normocephalic and atraumatic.  Eyes:     Conjunctiva/sclera: Conjunctivae normal.     Pupils: Pupils are equal, round, and reactive to light.  Cardiovascular:     Rate and Rhythm: Normal rate and regular rhythm.  Pulmonary:     Effort: Pulmonary effort is normal.     Breath sounds: Normal breath sounds.  Skin:    General: Skin is warm and dry.  Neurological:     General: No focal deficit present.     Mental Status: He is alert and oriented to person, place, and time.   Psychiatric:        Mood and Affect: Mood normal.        Behavior: Behavior normal.        Thought Content: Thought content normal.        Judgment: Judgment normal.      Impression and Plan:  Type 2 diabetes mellitus without complication, without long-term current use of insulin (HCC) Assessment & Plan: Uncontrolled with an A1c that has risen to 9.3 from 8.2 at last visit in March.  He admits to significant dietary indiscretions as well as not taking metformin consistently.  He will work on lifestyle changes, we have decided to add semaglutide 0.5 mg weekly.  He will return in 3 months for follow-up.  Orders: -     POCT glycosylated hemoglobin (Hb A1C) -     Aspirin; Take 1 tablet (81 mg total) by mouth daily. Swallow whole.  Dispense: 90 tablet; Refill: 3 -     metFORMIN HCl; TAKE 1 TABLET BY MOUTH TWICE A DAY WITH A MEAL  Dispense: 180 tablet; Refill: 1 -     Ozempic (0.25 or 0.5 MG/DOSE); Inject 0.5 mg into the skin once a week.  Dispense: 3 mL; Refill: 2  Hyperlipidemia, unspecified hyperlipidemia type Assessment & Plan: Last LDL was above goal at 80.  He is on maximum dose atorvastatin, ezetimibe was added but he has not yet started.  He will resume ezetimibe and recheck lipids next visit.  Orders: -     Atorvastatin Calcium; Take 1 tablet (80 mg total) by mouth daily.  Dispense: 90 tablet; Refill: 1  Primary hypertension Assessment & Plan: Well-controlled on lisinopril/hydrochlorothiazide.  Orders: -     Lisinopril-hydroCHLOROthiazide; Take 1 tablet by mouth daily.  Dispense: 90 tablet; Refill: 1  Microalbuminuria due to type 2 diabetes mellitus (HCC) Assessment & Plan: On lisinopril.   Mixed hyperlipidemia Assessment & Plan: Last LDL was above goal at 80.  He is on maximum dose atorvastatin, ezetimibe was added but he has not yet started.  He will resume ezetimibe and recheck lipids next visit.  Orders: -     Ezetimibe; Take 1 tablet (10 mg total) by mouth daily.   Dispense: 90 tablet; Refill: 1     Time spent:34 minutes reviewing chart, interviewing and examining patient and formulating plan of care.     Chaya Jan, MD [facility]

## 2022-11-29 NOTE — Assessment & Plan Note (Signed)
Last LDL was above goal at 80.  He is on maximum dose atorvastatin, ezetimibe was added but he has not yet started.  He will resume ezetimibe and recheck lipids next visit.

## 2022-12-04 ENCOUNTER — Telehealth: Payer: Self-pay

## 2022-12-04 NOTE — Telephone Encounter (Signed)
Pharmacy Patient Advocate Encounter   Received notification from CoverMyMeds that prior authorization for Ozempic (0.25 or 0.5 MG/DOSE) 2MG /3ML pen-injectors is required/requested.   Insurance verification completed.   The patient is insured through [facility] .     PA submitted to [facility] via CoverMyMeds Key/confirmation #/EOC [id number] Status is pending

## 2022-12-06 NOTE — Telephone Encounter (Signed)
Pharmacy Patient Advocate Encounter  Received notification from  Jari Favre  that Prior Authorization for Ozempic 0.25mg  or 0.5mg  has been APPROVED from 12/06/2022 to 12/06/2023.Marland Kitchen  PA #/Case ID/Reference #: [id number]

## 2023-02-28 ENCOUNTER — Encounter: Payer: Self-pay | Admitting: Internal Medicine

## 2023-02-28 ENCOUNTER — Ambulatory Visit: Payer: 59 | Admitting: Internal Medicine

## 2023-02-28 VITALS — BP 124/88 | HR 80 | Temp 97.9°F | Wt 179.6 lb

## 2023-02-28 DIAGNOSIS — I10 Essential (primary) hypertension: Secondary | ICD-10-CM | POA: Diagnosis not present

## 2023-02-28 DIAGNOSIS — E785 Hyperlipidemia, unspecified: Secondary | ICD-10-CM

## 2023-02-28 DIAGNOSIS — Z7984 Long term (current) use of oral hypoglycemic drugs: Secondary | ICD-10-CM | POA: Diagnosis not present

## 2023-02-28 DIAGNOSIS — Z23 Encounter for immunization: Secondary | ICD-10-CM | POA: Diagnosis not present

## 2023-02-28 DIAGNOSIS — E119 Type 2 diabetes mellitus without complications: Secondary | ICD-10-CM

## 2023-02-28 LAB — COMPREHENSIVE METABOLIC PANEL
ALT: 37 U/L (ref 0–53)
AST: 23 U/L (ref 0–37)
Albumin: 4.4 g/dL (ref 3.5–5.2)
Alkaline Phosphatase: 71 U/L (ref 39–117)
BUN: 10 mg/dL (ref 6–23)
CO2: 26 mEq/L (ref 19–32)
Calcium: 9.3 mg/dL (ref 8.4–10.5)
Chloride: 101 mEq/L (ref 96–112)
Creatinine, Ser: 0.72 mg/dL (ref 0.40–1.50)
GFR: 108.39 mL/min (ref >60.00)
Glucose, Bld: 191 mg/dL — ABNORMAL HIGH (ref 70–99)
Potassium: 4.2 mEq/L (ref 3.5–5.1)
Sodium: 137 mEq/L (ref 135–145)
Total Bilirubin: 0.9 mg/dL (ref 0.2–1.2)
Total Protein: 7 g/dL (ref 6.0–8.3)

## 2023-02-28 LAB — POCT GLYCOSYLATED HEMOGLOBIN (HGB A1C): Hemoglobin A1C: 9.3 % — AB (ref 4.0–5.6)

## 2023-02-28 LAB — LIPID PANEL
Cholesterol: 95 mg/dL (ref 0–200)
HDL: 41.8 mg/dL (ref >39.00)
LDL Cholesterol: 33 mg/dL (ref 0–99)
NonHDL: 52.98
Total CHOL/HDL Ratio: 2
Triglycerides: 101 mg/dL (ref 0.0–149.0)
VLDL: 20.2 mg/dL (ref 0.0–40.0)

## 2023-02-28 LAB — MICROALBUMIN / CREATININE URINE RATIO
Creatinine,U: 201.5 mg/dL
Microalb Creat Ratio: 10.4 mg/g (ref 0.0–30.0)
Microalb, Ur: 20.9 mg/dL — ABNORMAL HIGH (ref 0.0–1.9)

## 2023-02-28 NOTE — Assessment & Plan Note (Signed)
Well-controlled, continue current.

## 2023-02-28 NOTE — Assessment & Plan Note (Signed)
On atorvastatin 80 mg and ezetimibe 10 mg.  Recheck lipids today.

## 2023-02-28 NOTE — Addendum Note (Signed)
Addended by: Kern Reap B on: 02/28/2023 09:32 AM   Modules accepted: Orders

## 2023-02-28 NOTE — Progress Notes (Signed)
Established Patient Office Visit     CC/Reason for Visit: Follow-up chronic conditions  HPI: Christopher Hurley is a 47 y.o. male who is coming in today for the above mentioned reasons. Past Medical History is significant for: Hypertension, hyperlipidemia, type 2 diabetes, vitamin D deficiency.  He is requesting flu vaccine today.  He is under the impression that his Ozempic was not approved by his insurance and so never started.  He has been compliant with metformin and cholesterol medications.  After chart review it appears that the Ozempic required prior authorization but was approved back in June.   Past Medical/Surgical History: Past Medical History:  Diagnosis Date   Diabetes mellitus without complication (HCC)    on meds   Hyperlipidemia    on meds   Hypertension    on meds   Seasonal allergies    Ventral hernia     Past Surgical History:  Procedure Laterality Date   TIBIA FRACTURE SURGERY Right 1992    Social History:  reports that he has never smoked. He has never used smokeless tobacco. He reports current alcohol use of about 7.0 standard drinks of alcohol per week. He reports that he does not use drugs.  Allergies: Allergies  Allergen Reactions   Atorvastatin Nausea And Vomiting    NOT ALLERGIC**Atorvastatin caused nausea / vomiting but also metformin was started at the same time for DM2 causing diarrhea x 1 week . Most likely GI side effects were due to metformin    Family History:  Family History  Problem Relation Age of Onset   Diabetes Mother    Diabetes Father    Colon cancer Neg Hx    Colon polyps Neg Hx    Esophageal cancer Neg Hx    Rectal cancer Neg Hx    Stomach cancer Neg Hx      Current Outpatient Medications:    aspirin EC (ASPIRIN LOW DOSE) 81 MG tablet, Take 1 tablet (81 mg total) by mouth daily. Swallow whole., Disp: 90 tablet, Rfl: 3   atorvastatin (LIPITOR) 80 MG tablet, Take 1 tablet (80 mg total) by mouth daily., Disp: 90  tablet, Rfl: 1   blood glucose meter kit and supplies, Dispense based on patient and insurance preference. Use up to four times daily as directed. (FOR ICD-10 E10.9, E11.9)., Disp: 1 each, Rfl: 0   Blood Glucose Monitoring Suppl (ONE TOUCH ULTRA 2) w/Device KIT, Use as directed, Disp: 1 kit, Rfl: 1   ezetimibe (ZETIA) 10 MG tablet, Take 1 tablet (10 mg total) by mouth daily., Disp: 90 tablet, Rfl: 1   glucose blood (ONETOUCH ULTRA) test strip, 1 each by Other route daily. Dx E11.9, Disp: 100 each, Rfl: 12   lisinopril-hydrochlorothiazide (ZESTORETIC) 20-25 MG tablet, Take 1 tablet by mouth daily., Disp: 90 tablet, Rfl: 1   metFORMIN (GLUCOPHAGE) 1000 MG tablet, TAKE 1 TABLET BY MOUTH TWICE A DAY WITH A MEAL, Disp: 180 tablet, Rfl: 1   Multiple Vitamin (MULTIVITAMIN) capsule, Take 1 capsule by mouth daily., Disp: , Rfl:    Multiple Vitamins-Minerals (PRESERVISION AREDS PO), Take 1 tablet by mouth daily at 6 (six) AM., Disp: , Rfl:    OneTouch Delica Lancets 33G MISC, Use as directed once a day, Disp: 100 each, Rfl: 1   Semaglutide,0.25 or 0.5MG /DOS, (OZEMPIC, 0.25 OR 0.5 MG/DOSE,) 2 MG/3ML SOPN, Inject 0.5 mg into the skin once a week., Disp: 3 mL, Rfl: 2  Review of Systems:  Negative unless indicated in HPI.  Physical Exam: Vitals:   02/28/23 0805  BP: 124/88  Pulse: 80  Temp: 97.9 F (36.6 C)  TempSrc: Oral  SpO2: 98%  Weight: 179 lb 9.6 oz (81.5 kg)    Body mass index is 26.14 kg/m.   Physical Exam Vitals reviewed.  Constitutional:      Appearance: Normal appearance.  HENT:     Head: Normocephalic and atraumatic.  Eyes:     Conjunctiva/sclera: Conjunctivae normal.     Pupils: Pupils are equal, round, and reactive to light.  Cardiovascular:     Rate and Rhythm: Normal rate and regular rhythm.  Pulmonary:     Effort: Pulmonary effort is normal.     Breath sounds: Normal breath sounds.  Skin:    General: Skin is warm and dry.  Neurological:     General: No focal  deficit present.     Mental Status: He is alert and oriented to person, place, and time.  Psychiatric:        Mood and Affect: Mood normal.        Behavior: Behavior normal.        Thought Content: Thought content normal.        Judgment: Judgment normal.      Impression and Plan:  Type 2 diabetes mellitus without complication, without long-term current use of insulin (HCC) Assessment & Plan: Remains uncontrolled with an A1c of 9.3.  His Ozempic was approved however he was not notified of this and so never started.  See him back in 3 months.  Continue metformin.  Orders: -     POCT glycosylated hemoglobin (Hb A1C) -     Microalbumin / creatinine urine ratio; Future -     Comprehensive metabolic panel; Future  Hyperlipidemia, unspecified hyperlipidemia type Assessment & Plan: On atorvastatin 80 mg and ezetimibe 10 mg.  Recheck lipids today.  Orders: -     Lipid panel; Future  Primary hypertension Assessment & Plan: Well-controlled, continue current.   Immunization due   -Flu vaccine administered today.  Time spent:32 minutes reviewing chart, interviewing and examining patient and formulating plan of care.     Chaya Jan, MD [facility]

## 2023-02-28 NOTE — Assessment & Plan Note (Signed)
Remains uncontrolled with an A1c of 9.3.  His Ozempic was approved however he was not notified of this and so never started.  See him back in 3 months.  Continue metformin.

## 2023-03-20 ENCOUNTER — Emergency Department (HOSPITAL_COMMUNITY)
Admission: EM | Admit: 2023-03-20 | Discharge: 2023-03-21 | Disposition: A | Discharge status: Home / Self Care | Payer: 59 | Attending: Emergency Medicine | Admitting: Emergency Medicine

## 2023-03-20 ENCOUNTER — Encounter (HOSPITAL_COMMUNITY): Payer: Self-pay | Admitting: Emergency Medicine

## 2023-03-20 DIAGNOSIS — K219 Gastro-esophageal reflux disease without esophagitis: Secondary | ICD-10-CM | POA: Diagnosis present

## 2023-03-20 DIAGNOSIS — Z7984 Long term (current) use of oral hypoglycemic drugs: Secondary | ICD-10-CM | POA: Diagnosis not present

## 2023-03-20 DIAGNOSIS — E119 Type 2 diabetes mellitus without complications: Secondary | ICD-10-CM | POA: Diagnosis not present

## 2023-03-20 DIAGNOSIS — Z79899 Other long term (current) drug therapy: Secondary | ICD-10-CM | POA: Insufficient documentation

## 2023-03-20 DIAGNOSIS — Z7982 Long term (current) use of aspirin: Secondary | ICD-10-CM | POA: Diagnosis not present

## 2023-03-20 DIAGNOSIS — I10 Essential (primary) hypertension: Secondary | ICD-10-CM | POA: Insufficient documentation

## 2023-03-20 NOTE — ED Triage Notes (Signed)
Pt having pain in R shoulder that seems to be referred from GI belching and reflux symptoms. Denies pain in belly or chest. Seems to occur mostly after eating and been going on for about a week. Hx of diabetes and HTN.

## 2023-03-21 LAB — CBC WITH DIFFERENTIAL/PLATELET
Abs Immature Granulocytes: 0.02 10*3/uL (ref 0.00–0.07)
Basophils Absolute: 0 10*3/uL (ref 0.0–0.1)
Basophils Relative: 1 %
Eosinophils Absolute: 0.2 10*3/uL (ref 0.0–0.5)
Eosinophils Relative: 3 %
HCT: 39.9 % (ref 39.0–52.0)
Hemoglobin: 14.1 g/dL (ref 13.0–17.0)
Immature Granulocytes: 0 %
Lymphocytes Relative: 21 %
Lymphs Abs: 1.5 10*3/uL (ref 0.7–4.0)
MCH: 33.7 pg (ref 26.0–34.0)
MCHC: 35.3 g/dL (ref 30.0–36.0)
MCV: 95.2 fL (ref 80.0–100.0)
Monocytes Absolute: 0.6 10*3/uL (ref 0.1–1.0)
Monocytes Relative: 9 %
Neutro Abs: 4.7 10*3/uL (ref 1.7–7.7)
Neutrophils Relative %: 66 %
Platelets: 198 10*3/uL (ref 150–400)
RBC: 4.19 MIL/uL — ABNORMAL LOW (ref 4.22–5.81)
RDW: 11.9 % (ref 11.5–15.5)
WBC: 7.2 10*3/uL (ref 4.0–10.5)
nRBC: 0 % (ref 0.0–0.2)

## 2023-03-21 LAB — COMPREHENSIVE METABOLIC PANEL
ALT: 37 U/L (ref 0–44)
AST: 19 U/L (ref 15–41)
Albumin: 3.9 g/dL (ref 3.5–5.0)
Alkaline Phosphatase: 69 U/L (ref 38–126)
Anion gap: 10 (ref 5–15)
BUN: 14 mg/dL (ref 6–20)
CO2: 26 mmol/L (ref 22–32)
Calcium: 9.4 mg/dL (ref 8.9–10.3)
Chloride: 98 mmol/L (ref 98–111)
Creatinine, Ser: 0.93 mg/dL (ref 0.61–1.24)
GFR, Estimated: >60 mL/min (ref >60)
Glucose, Bld: 231 mg/dL — ABNORMAL HIGH (ref 70–99)
Potassium: 3.7 mmol/L (ref 3.5–5.1)
Sodium: 134 mmol/L — ABNORMAL LOW (ref 135–145)
Total Bilirubin: 0.8 mg/dL (ref 0.3–1.2)
Total Protein: 6.8 g/dL (ref 6.5–8.1)

## 2023-03-21 LAB — LIPASE, BLOOD: Lipase: 33 U/L (ref 11–51)

## 2023-03-21 MED ORDER — PANTOPRAZOLE SODIUM 40 MG PO TBEC: 40.0000 mg | DELAYED_RELEASE_TABLET | Freq: Every day | ORAL | 0 refills | Status: DC

## 2023-03-21 NOTE — Discharge Instructions (Signed)
Your work up is reassuring. You likely have acid reflux. I have sent medicine to your pharmacy. Follow up with your PCP. Return for any concerning symptoms.

## 2023-03-21 NOTE — ED Provider Notes (Signed)
[place] EMERGENCY DEPARTMENT AT [facility] Provider Note   CSN: [id number] Arrival date & time: 03/20/23  2258     History  Chief Complaint  Patient presents with   Gastroesophageal Reflux    Christopher Hurley is a 47 y.o. male.  47 year old male presents today for concern of reflux that has been intermittently going on over the past week.  Worse after eating.  He also endorses some pain in his right upper back.  No chest pain, shortness of breath, lightheadedness, diaphoresis.  Denies any abdominal pain, nausea, vomiting.  He does not take anything for acid reflux.  He does have a PCP.  He states after arriving to the emergency department his symptoms have gone away and he has been asymptomatic since.  No prior history of CAD.  The history is provided by the patient. No language interpreter was used.       Home Medications Prior to Admission medications   Medication Sig Start Date End Date Taking? Authorizing Provider  pantoprazole (PROTONIX) 40 MG tablet Take 1 tablet (40 mg total) by mouth daily. 03/21/23  Yes Karie Mainland, Rynlee Lisbon, PA-C  aspirin EC (ASPIRIN LOW DOSE) 81 MG tablet Take 1 tablet (81 mg total) by mouth daily. Swallow whole. 11/29/22   Philip Aspen, Limmie Patricia, MD  atorvastatin (LIPITOR) 80 MG tablet Take 1 tablet (80 mg total) by mouth daily. 11/29/22   Philip Aspen, Limmie Patricia, MD  blood glucose meter kit and supplies Dispense based on patient and insurance preference. Use up to four times daily as directed. (FOR ICD-10 E10.9, E11.9). 01/08/22   Philip Aspen, Limmie Patricia, MD  Blood Glucose Monitoring Suppl (ONE TOUCH ULTRA 2) w/Device KIT Use as directed 03/30/22   Philip Aspen, Limmie Patricia, MD  ezetimibe (ZETIA) 10 MG tablet Take 1 tablet (10 mg total) by mouth daily. 11/29/22   Philip Aspen, Limmie Patricia, MD  glucose blood (ONETOUCH ULTRA) test strip 1 each by Other route daily. Dx E11.9 06/05/22   Philip Aspen, Limmie Patricia, MD   lisinopril-hydrochlorothiazide (ZESTORETIC) 20-25 MG tablet Take 1 tablet by mouth daily. 11/29/22   Philip Aspen, Limmie Patricia, MD  metFORMIN (GLUCOPHAGE) 1000 MG tablet TAKE 1 TABLET BY MOUTH TWICE A DAY WITH A MEAL 11/29/22   Philip Aspen, Limmie Patricia, MD  Multiple Vitamin (MULTIVITAMIN) capsule Take 1 capsule by mouth daily.    [provider]  Multiple Vitamins-Minerals (PRESERVISION AREDS PO) Take 1 tablet by mouth daily at 6 (six) AM.    [provider]  OneTouch Delica Lancets 33G MISC Use as directed once a day 03/30/22   Philip Aspen, Limmie Patricia, MD  Semaglutide,0.25 or 0.5MG /DOS, (OZEMPIC, 0.25 OR 0.5 MG/DOSE,) 2 MG/3ML SOPN Inject 0.5 mg into the skin once a week. 11/29/22   Philip Aspen, Limmie Patricia, MD      Allergies    Atorvastatin    Review of Systems   Review of Systems  Constitutional:  Negative for activity change and fever.  Respiratory:  Negative for shortness of breath.   Cardiovascular:  Negative for chest pain.  Gastrointestinal:  Negative for abdominal pain, nausea and vomiting.  Musculoskeletal:  Positive for back pain.  All other systems reviewed and are negative.   Physical Exam Updated Vital Signs BP 136/88   Pulse 69   Temp 98.2 F (36.8 C) (Oral)   Resp 16   SpO2 100%  Physical Exam Vitals and nursing note reviewed.  Constitutional:      General: He  is not in acute distress.    Appearance: Normal appearance. He is not ill-appearing.  HENT:     Head: Normocephalic and atraumatic.     Nose: Nose normal.  Eyes:     Conjunctiva/sclera: Conjunctivae normal.  Cardiovascular:     Rate and Rhythm: Normal rate and regular rhythm.  Pulmonary:     Effort: Pulmonary effort is normal. No respiratory distress.  Abdominal:     General: There is no distension.     Palpations: Abdomen is soft.     Tenderness: There is no abdominal tenderness. There is no guarding.  Musculoskeletal:        General: No deformity. Normal range of motion.   Skin:    Findings: No rash.  Neurological:     Mental Status: He is alert.     ED Results / Procedures / Treatments   Labs (all labs ordered are listed, but only abnormal results are displayed) Labs Reviewed  COMPREHENSIVE METABOLIC PANEL - Abnormal; Notable for the following components:      Result Value   Sodium 134 (*)    Glucose, Bld 231 (*)    All other components within normal limits  CBC WITH DIFFERENTIAL/PLATELET - Abnormal; Notable for the following components:   RBC 4.19 (*)    All other components within normal limits  LIPASE, BLOOD    EKG None  Radiology No results found.  Procedures Procedures    Medications Ordered in ED Medications - No data to display  ED Course/ Medical Decision Making/ A&P                                 Medical Decision Making Amount and/or Complexity of Data Reviewed Labs: ordered.  Risk Prescription drug management.   Medical Decision Making / ED Course   This patient presents to the ED for concern of reflux, right-sided upper back pain, this involves an extensive number of treatment options, and is a complaint that carries with it a high risk of complications and morbidity.  The differential diagnosis includes GERD, cholelithiasis, cholecystitis, pneumonia, PE, ACS  MDM: 47 year old male who presents today for concern of reflux symptoms over the past week that have been intermittent.  He is not on any PPI.  No cough, fever.  Low suspicion for pneumonia.  Low risk for PE on Wells criteria.  PERC negative.  Currently asymptomatic.  Likely GERD.  Will prescribe PPI.  He will follow-up with his PCP.  I offered additional blood work with troponin and chest x-ray but he defers.  Low suspicion for ACS given chest pain, shortness of breath or other anginal symptoms.  This is not unreasonable.  Strict return precautions given.  He voices understanding and is in agreement with plan.  Lab Tests: -I ordered, reviewed, and  interpreted labs.   The pertinent results include:   Labs Reviewed  COMPREHENSIVE METABOLIC PANEL - Abnormal; Notable for the following components:      Result Value   Sodium 134 (*)    Glucose, Bld 231 (*)    All other components within normal limits  CBC WITH DIFFERENTIAL/PLATELET - Abnormal; Notable for the following components:   RBC 4.19 (*)    All other components within normal limits  LIPASE, BLOOD      EKG  EKG Interpretation Date/Time:    Ventricular Rate:    PR Interval:    QRS Duration:    QT Interval:  QTC Calculation:   R Axis:      Text Interpretation:           Medicines ordered and prescription drug management: Meds ordered this encounter  Medications   pantoprazole (PROTONIX) 40 MG tablet    Sig: Take 1 tablet (40 mg total) by mouth daily.    Dispense:  30 tablet    Refill:  0    Order Specific Question:   Supervising Provider    Answer:   Hyacinth Meeker, BRIAN [[id number]]    -I have reviewed the patients home medicines and have made adjustments as needed   Reevaluation: After the interventions noted above, I reevaluated the patient and found that they have :resolved  Co morbidities that complicate the patient evaluation  Past Medical History:  Diagnosis Date   Diabetes mellitus without complication (HCC)    on meds   Hyperlipidemia    on meds   Hypertension    on meds   Seasonal allergies    Ventral hernia       Dispostion: Patient discharged in stable condition..  Patient voices understanding and is in agreement with plan.  Final Clinical Impression(s) / ED Diagnoses Final diagnoses:  Gastroesophageal reflux disease, unspecified whether esophagitis present    Rx / DC Orders ED Discharge Orders          Ordered    pantoprazole (PROTONIX) 40 MG tablet  Daily        03/21/23 0846              Marita Kansas, PA-C 03/21/23 1610    Alvira Monday, MD 03/21/23 2210

## 2023-04-08 ENCOUNTER — Other Ambulatory Visit: Payer: Self-pay | Admitting: Internal Medicine

## 2023-04-08 DIAGNOSIS — E782 Mixed hyperlipidemia: Secondary | ICD-10-CM

## 2023-05-13 ENCOUNTER — Encounter: Payer: Self-pay | Admitting: Internal Medicine

## 2023-05-13 ENCOUNTER — Ambulatory Visit: Payer: 59 | Admitting: Internal Medicine

## 2023-05-13 VITALS — BP 110/80 | HR 70 | Temp 98.8°F | Wt 176.9 lb

## 2023-05-13 DIAGNOSIS — I10 Essential (primary) hypertension: Secondary | ICD-10-CM | POA: Diagnosis not present

## 2023-05-13 DIAGNOSIS — E559 Vitamin D deficiency, unspecified: Secondary | ICD-10-CM

## 2023-05-13 DIAGNOSIS — E1129 Type 2 diabetes mellitus with other diabetic kidney complication: Secondary | ICD-10-CM | POA: Diagnosis not present

## 2023-05-13 DIAGNOSIS — E782 Mixed hyperlipidemia: Secondary | ICD-10-CM

## 2023-05-13 DIAGNOSIS — E119 Type 2 diabetes mellitus without complications: Secondary | ICD-10-CM

## 2023-05-13 DIAGNOSIS — K219 Gastro-esophageal reflux disease without esophagitis: Secondary | ICD-10-CM | POA: Insufficient documentation

## 2023-05-13 DIAGNOSIS — R809 Proteinuria, unspecified: Secondary | ICD-10-CM

## 2023-05-13 LAB — POCT GLYCOSYLATED HEMOGLOBIN (HGB A1C): Hemoglobin A1C: 7.8 % — AB (ref 4.0–5.6)

## 2023-05-13 NOTE — Assessment & Plan Note (Signed)
A1c improved from 9.3 in September to 7.8 today.  He has yet to start Ozempic despite his insurance covering and prior Auth done and approved in June.  He will pick up medication from pharmacy.

## 2023-05-13 NOTE — Progress Notes (Signed)
Established Patient Office Visit     CC/Reason for Visit: Follow-up chronic conditions  HPI: Christopher Hurley is a 47 y.o. male who is coming in today for the above mentioned reasons. Past Medical History is significant for: Hypertension, hyperlipidemia, type 2 diabetes, vitamin D deficiency.  Feeling well without acute concerns or complaints.  He has been focusing more on lifestyle changes, is walking, has lost close to 5 pounds since last seen.  He had to visit the emergency department for abdominal pain and belching and was diagnosed with a GERD and started on PPI therapy with significant improvement.   Past Medical/Surgical History: Past Medical History:  Diagnosis Date   Diabetes mellitus without complication (HCC)    on meds   Hyperlipidemia    on meds   Hypertension    on meds   Seasonal allergies    Ventral hernia     Past Surgical History:  Procedure Laterality Date   TIBIA FRACTURE SURGERY Right 1992    Social History:  reports that he has never smoked. He has never used smokeless tobacco. He reports current alcohol use of about 7.0 standard drinks of alcohol per week. He reports that he does not use drugs.  Allergies: Allergies  Allergen Reactions   Atorvastatin Nausea And Vomiting    NOT ALLERGIC**Atorvastatin caused nausea / vomiting but also metformin was started at the same time for DM2 causing diarrhea x 1 week . Most likely GI side effects were due to metformin    Family History:  Family History  Problem Relation Age of Onset   Diabetes Mother    Diabetes Father    Colon cancer Neg Hx    Colon polyps Neg Hx    Esophageal cancer Neg Hx    Rectal cancer Neg Hx    Stomach cancer Neg Hx      Current Outpatient Medications:    aspirin EC (ASPIRIN LOW DOSE) 81 MG tablet, Take 1 tablet (81 mg total) by mouth daily. Swallow whole., Disp: 90 tablet, Rfl: 3   atorvastatin (LIPITOR) 80 MG tablet, Take 1 tablet (80 mg total) by mouth daily.,  Disp: 90 tablet, Rfl: 1   blood glucose meter kit and supplies, Dispense based on patient and insurance preference. Use up to four times daily as directed. (FOR ICD-10 E10.9, E11.9)., Disp: 1 each, Rfl: 0   Blood Glucose Monitoring Suppl (ONE TOUCH ULTRA 2) w/Device KIT, Use as directed, Disp: 1 kit, Rfl: 1   ezetimibe (ZETIA) 10 MG tablet, TAKE 1 TABLET BY MOUTH EVERY DAY, Disp: 90 tablet, Rfl: 1   glucose blood (ONETOUCH ULTRA) test strip, 1 each by Other route daily. Dx E11.9, Disp: 100 each, Rfl: 12   lisinopril-hydrochlorothiazide (ZESTORETIC) 20-25 MG tablet, Take 1 tablet by mouth daily., Disp: 90 tablet, Rfl: 1   metFORMIN (GLUCOPHAGE) 1000 MG tablet, TAKE 1 TABLET BY MOUTH TWICE A DAY WITH A MEAL, Disp: 180 tablet, Rfl: 1   Multiple Vitamin (MULTIVITAMIN) capsule, Take 1 capsule by mouth daily., Disp: , Rfl:    Multiple Vitamins-Minerals (PRESERVISION AREDS PO), Take 1 tablet by mouth daily at 6 (six) AM., Disp: , Rfl:    OneTouch Delica Lancets 33G MISC, Use as directed once a day, Disp: 100 each, Rfl: 1   pantoprazole (PROTONIX) 40 MG tablet, Take 1 tablet (40 mg total) by mouth daily., Disp: 30 tablet, Rfl: 0   Semaglutide,0.25 or 0.5MG /DOS, (OZEMPIC, 0.25 OR 0.5 MG/DOSE,) 2 MG/3ML SOPN, Inject 0.5 mg into  the skin once a week. (Patient not taking: Reported on 05/13/2023), Disp: 3 mL, Rfl: 2  Review of Systems:  Negative unless indicated in HPI.   Physical Exam: Vitals:   05/13/23 0821  BP: 110/80  Pulse: 70  Temp: 98.8 F (37.1 C)  TempSrc: Oral  SpO2: 98%  Weight: 176 lb 14.4 oz (80.2 kg)    Body mass index is 25.75 kg/m.   Physical Exam   Impression and Plan:  Type 2 diabetes mellitus without complication, without long-term current use of insulin (HCC) Assessment & Plan: A1c improved from 9.3 in September to 7.8 today.  He has yet to start Ozempic despite his insurance covering and prior Auth done and approved in June.  He will pick up medication from  pharmacy.  Orders: -     POCT glycosylated hemoglobin (Hb A1C)  Mixed hyperlipidemia Assessment & Plan: Excellent control on max dose atorvastatin and ezetimibe.   Primary hypertension Assessment & Plan: Well-controlled on current.   Microalbuminuria due to type 2 diabetes mellitus [facility]) Assessment & Plan: On lisinopril.   Vitamin D deficiency Assessment & Plan: Last check was within range.   Gastroesophageal reflux disease, unspecified whether esophagitis present Assessment & Plan: Doing well on daily PPI therapy.      Time spent:32 minutes reviewing chart, interviewing and examining patient and formulating plan of care.     Chaya Jan, MD [facility]

## 2023-05-13 NOTE — Assessment & Plan Note (Signed)
On lisinopril

## 2023-05-13 NOTE — Assessment & Plan Note (Signed)
Last check was within range.

## 2023-05-13 NOTE — Assessment & Plan Note (Signed)
Doing well on daily PPI therapy.

## 2023-05-13 NOTE — Assessment & Plan Note (Signed)
Excellent control on max dose atorvastatin and ezetimibe.

## 2023-05-13 NOTE — Assessment & Plan Note (Signed)
Well controlled on current

## 2023-05-27 ENCOUNTER — Other Ambulatory Visit: Payer: Self-pay | Admitting: *Deleted

## 2023-05-27 DIAGNOSIS — I10 Essential (primary) hypertension: Secondary | ICD-10-CM

## 2023-05-27 DIAGNOSIS — E119 Type 2 diabetes mellitus without complications: Secondary | ICD-10-CM

## 2023-05-27 MED ORDER — METFORMIN HCL 1000 MG PO TABS: ORAL_TABLET | ORAL | 1 refills | Status: DC

## 2023-05-27 MED ORDER — OZEMPIC (0.25 OR 0.5 MG/DOSE) 2 MG/3ML ~~LOC~~ SOPN: 0.5000 mg | PEN_INJECTOR | SUBCUTANEOUS | 2 refills | Status: DC

## 2023-05-27 MED ORDER — LISINOPRIL-HYDROCHLOROTHIAZIDE 20-25 MG PO TABS: 1.0000 | ORAL_TABLET | Freq: Every day | ORAL | 1 refills | Status: DC

## 2023-08-13 ENCOUNTER — Ambulatory Visit: Payer: Self-pay | Admitting: Internal Medicine

## 2023-08-13 DIAGNOSIS — E119 Type 2 diabetes mellitus without complications: Secondary | ICD-10-CM

## 2023-09-09 ENCOUNTER — Other Ambulatory Visit: Payer: Self-pay | Admitting: Internal Medicine

## 2023-09-09 DIAGNOSIS — E119 Type 2 diabetes mellitus without complications: Secondary | ICD-10-CM

## 2023-09-09 DIAGNOSIS — I10 Essential (primary) hypertension: Secondary | ICD-10-CM

## 2023-09-09 DIAGNOSIS — E782 Mixed hyperlipidemia: Secondary | ICD-10-CM

## 2023-10-08 ENCOUNTER — Other Ambulatory Visit: Payer: Self-pay | Admitting: Internal Medicine

## 2023-11-07 ENCOUNTER — Other Ambulatory Visit: Payer: Self-pay | Admitting: Internal Medicine

## 2023-11-07 DIAGNOSIS — E785 Hyperlipidemia, unspecified: Secondary | ICD-10-CM

## 2023-11-26 ENCOUNTER — Telehealth: Payer: Self-pay

## 2023-11-26 ENCOUNTER — Other Ambulatory Visit (HOSPITAL_COMMUNITY): Payer: Self-pay

## 2023-11-26 NOTE — Telephone Encounter (Signed)
 Pharmacy Patient Advocate Encounter   Received notification from Onbase that prior authorization for Ozempic  (0.25 or 0.5 MG/DOSE) 2MG /3ML pen-injectors is required/requested.   Insurance verification completed.   The patient is insured through [employer] .   Per test claim: PA required; PA submitted to above mentioned insurance via CoverMyMeds Key/confirmation #/EOC [id number] Status is pending

## 2023-11-28 ENCOUNTER — Other Ambulatory Visit (HOSPITAL_COMMUNITY): Payer: Self-pay

## 2023-11-28 NOTE — Telephone Encounter (Signed)
 Pharmacy Patient Advocate Encounter  Received notification from [employer] that Prior Authorization for Ozempic  (0.25 or 0.5 MG/DOSE) 2MG /3ML pen-injectors  has been APPROVED from 11/27/23 to 11/26/24. Ran test claim, Copay is $24.99. This test claim was processed through [facility]- copay amounts may vary at other pharmacies due to pharmacy/plan contracts, or as the patient moves through the different stages of their insurance plan.   PA #/Case ID/Reference #: [id number]

## 2024-07-15 ENCOUNTER — Emergency Department (HOSPITAL_COMMUNITY)

## 2024-07-15 ENCOUNTER — Encounter (HOSPITAL_COMMUNITY): Admission: EM | Disposition: A | Payer: Self-pay | Source: Home / Self Care | Attending: Family Medicine

## 2024-07-15 ENCOUNTER — Observation Stay (HOSPITAL_COMMUNITY)
Admission: EM | Admit: 2024-07-15 | Discharge: 2024-07-16 | Disposition: A | Source: Home / Self Care | Attending: Family Medicine | Admitting: Family Medicine

## 2024-07-15 ENCOUNTER — Other Ambulatory Visit (HOSPITAL_COMMUNITY): Payer: Self-pay

## 2024-07-15 ENCOUNTER — Inpatient Hospital Stay (HOSPITAL_COMMUNITY): Admitting: Anesthesiology

## 2024-07-15 ENCOUNTER — Other Ambulatory Visit: Payer: Self-pay

## 2024-07-15 ENCOUNTER — Encounter (HOSPITAL_COMMUNITY): Payer: Self-pay | Admitting: Family Medicine

## 2024-07-15 DIAGNOSIS — K810 Acute cholecystitis: Principal | ICD-10-CM | POA: Diagnosis present

## 2024-07-15 DIAGNOSIS — R748 Abnormal levels of other serum enzymes: Secondary | ICD-10-CM

## 2024-07-15 DIAGNOSIS — E785 Hyperlipidemia, unspecified: Secondary | ICD-10-CM | POA: Diagnosis present

## 2024-07-15 DIAGNOSIS — K219 Gastro-esophageal reflux disease without esophagitis: Secondary | ICD-10-CM | POA: Diagnosis present

## 2024-07-15 DIAGNOSIS — E119 Type 2 diabetes mellitus without complications: Secondary | ICD-10-CM

## 2024-07-15 DIAGNOSIS — R112 Nausea with vomiting, unspecified: Secondary | ICD-10-CM

## 2024-07-15 DIAGNOSIS — I10 Essential (primary) hypertension: Secondary | ICD-10-CM | POA: Diagnosis present

## 2024-07-15 LAB — CBG MONITORING, ED: Glucose-Capillary: 114 mg/dL — ABNORMAL HIGH (ref 70–99)

## 2024-07-15 LAB — CBC
HCT: 40.3 % (ref 39.0–52.0)
Hemoglobin: 14.2 g/dL (ref 13.0–17.0)
MCH: 32.8 pg (ref 26.0–34.0)
MCHC: 35.2 g/dL (ref 30.0–36.0)
MCV: 93.1 fL (ref 80.0–100.0)
Platelets: 202 10*3/uL (ref 150–400)
RBC: 4.33 MIL/uL (ref 4.22–5.81)
RDW: 12.6 % (ref 11.5–15.5)
WBC: 9.6 10*3/uL (ref 4.0–10.5)
nRBC: 0 % (ref 0.0–0.2)

## 2024-07-15 LAB — COMPREHENSIVE METABOLIC PANEL WITH GFR
ALT: 27 U/L (ref 0–44)
AST: 23 U/L (ref 15–41)
Albumin: 4.6 g/dL (ref 3.5–5.0)
Alkaline Phosphatase: 86 U/L (ref 38–126)
Anion gap: 13 (ref 5–15)
BUN: 16 mg/dL (ref 6–20)
CO2: 25 mmol/L (ref 22–32)
Calcium: 9.6 mg/dL (ref 8.9–10.3)
Chloride: 99 mmol/L (ref 98–111)
Creatinine, Ser: 0.73 mg/dL (ref 0.61–1.24)
GFR, Estimated: >60 mL/min
Glucose, Bld: 207 mg/dL — ABNORMAL HIGH (ref 70–99)
Potassium: 3.7 mmol/L (ref 3.5–5.1)
Sodium: 136 mmol/L (ref 135–145)
Total Bilirubin: 0.4 mg/dL (ref 0.0–1.2)
Total Protein: 7.3 g/dL (ref 6.5–8.1)

## 2024-07-15 LAB — URINALYSIS, ROUTINE W REFLEX MICROSCOPIC
Bacteria, UA: NONE SEEN
Bilirubin Urine: NEGATIVE
Glucose, UA: 50 mg/dL — AB
Hgb urine dipstick: NEGATIVE
Ketones, ur: NEGATIVE mg/dL
Leukocytes,Ua: NEGATIVE
Nitrite: NEGATIVE
Protein, ur: 30 mg/dL — AB
Specific Gravity, Urine: 1.02 (ref 1.005–1.030)
pH: 5 (ref 5.0–8.0)

## 2024-07-15 LAB — HEMOGLOBIN A1C
Hgb A1c MFr Bld: 7.3 % — ABNORMAL HIGH (ref 4.8–5.6)
Mean Plasma Glucose: 162.81 mg/dL

## 2024-07-15 LAB — MAGNESIUM: Magnesium: 1.5 mg/dL — ABNORMAL LOW (ref 1.7–2.4)

## 2024-07-15 LAB — GLUCOSE, CAPILLARY
Glucose-Capillary: 111 mg/dL — ABNORMAL HIGH (ref 70–99)
Glucose-Capillary: 168 mg/dL — ABNORMAL HIGH (ref 70–99)
Glucose-Capillary: 130 mg/dL — ABNORMAL HIGH (ref 70–99)

## 2024-07-15 LAB — LIPID PANEL
Cholesterol: 119 mg/dL (ref 0–200)
HDL: 53 mg/dL
LDL Cholesterol: 54 mg/dL (ref 0–99)
Total CHOL/HDL Ratio: 2.3 ratio
Triglycerides: 60 mg/dL
VLDL: 12 mg/dL (ref 0–40)

## 2024-07-15 LAB — PHOSPHORUS: Phosphorus: 3.4 mg/dL (ref 2.5–4.6)

## 2024-07-15 LAB — HIV ANTIBODY (ROUTINE TESTING W REFLEX): HIV Screen 4th Generation wRfx: NONREACTIVE

## 2024-07-15 LAB — LIPASE, BLOOD: Lipase: 243 U/L — ABNORMAL HIGH (ref 11–51)

## 2024-07-15 LAB — PRO BRAIN NATRIURETIC PEPTIDE: Pro Brain Natriuretic Peptide: 352 pg/mL — ABNORMAL HIGH

## 2024-07-15 MED ORDER — SUCCINYLCHOLINE CHLORIDE 200 MG/10ML IV SOSY
PREFILLED_SYRINGE | INTRAVENOUS | Status: DC | PRN
  Administered 2024-07-15: 100 mg via INTRAVENOUS

## 2024-07-15 MED ORDER — SUGAMMADEX SODIUM 200 MG/2ML IV SOLN
INTRAVENOUS | Status: DC | PRN
  Administered 2024-07-15: 200 mg via INTRAVENOUS

## 2024-07-15 MED ORDER — FLEET ENEMA RE ENEM: 1.0000 | ENEMA | Freq: Once | RECTAL | Status: DC | PRN

## 2024-07-15 MED ORDER — SODIUM CHLORIDE 0.9 % IV SOLN: INTRAVENOUS | Status: AC

## 2024-07-15 MED ORDER — SODIUM CHLORIDE 0.9% FLUSH
3.0000 mL | Freq: Two times a day (BID) | INTRAVENOUS | Status: DC
  Administered 2024-07-15 – 2024-07-16 (×2): 3 mL via INTRAVENOUS

## 2024-07-15 MED ORDER — OXYCODONE HCL 5 MG PO TABS: 5.0000 mg | ORAL_TABLET | Freq: Once | ORAL | Status: DC | PRN

## 2024-07-15 MED ORDER — OXYCODONE HCL 5 MG/5ML PO SOLN: 5.0000 mg | Freq: Once | ORAL | Status: DC | PRN

## 2024-07-15 MED ORDER — BUPIVACAINE HCL 0.25 % IJ SOLN
INTRAMUSCULAR | Status: DC | PRN
  Administered 2024-07-15: 30 mL

## 2024-07-15 MED ORDER — ACETAMINOPHEN 500 MG PO TABS
1000.0000 mg | ORAL_TABLET | Freq: Four times a day (QID) | ORAL | 3 refills | Status: AC
  Filled 2024-07-15: qty 120, 15d supply, fill #0

## 2024-07-15 MED ORDER — ORAL CARE MOUTH RINSE: 15.0000 mL | Freq: Once | OROMUCOSAL | Status: AC

## 2024-07-15 MED ORDER — PROPOFOL 10 MG/ML IV BOLUS
INTRAVENOUS | Status: AC
  Filled 2024-07-15: qty 20

## 2024-07-15 MED ORDER — ACETAMINOPHEN 325 MG PO TABS
650.0000 mg | ORAL_TABLET | Freq: Four times a day (QID) | ORAL | Status: DC | PRN
  Administered 2024-07-15: 650 mg via ORAL
  Filled 2024-07-15: qty 2

## 2024-07-15 MED ORDER — FENTANYL CITRATE (PF) 100 MCG/2ML IJ SOLN
25.0000 ug | INTRAMUSCULAR | Status: DC | PRN
  Administered 2024-07-15 (×2): 50 ug via INTRAVENOUS

## 2024-07-15 MED ORDER — SEMAGLUTIDE(0.25 OR 0.5MG/DOS) 2 MG/3ML ~~LOC~~ SOPN: 0.5000 mg | PEN_INJECTOR | SUBCUTANEOUS | Status: DC

## 2024-07-15 MED ORDER — ROCURONIUM BROMIDE 10 MG/ML (PF) SYRINGE
PREFILLED_SYRINGE | INTRAVENOUS | Status: DC | PRN
  Administered 2024-07-15: 20 mg via INTRAVENOUS
  Administered 2024-07-15: 10 mg via INTRAVENOUS
  Administered 2024-07-15: 30 mg via INTRAVENOUS

## 2024-07-15 MED ORDER — HYDROMORPHONE HCL 1 MG/ML IJ SOLN: 0.5000 mg | INTRAMUSCULAR | Status: DC | PRN

## 2024-07-15 MED ORDER — ONDANSETRON HCL 4 MG PO TABS: 4.0000 mg | ORAL_TABLET | Freq: Four times a day (QID) | ORAL | Status: DC | PRN

## 2024-07-15 MED ORDER — HYDRALAZINE HCL 20 MG/ML IJ SOLN: 10.0000 mg | INTRAMUSCULAR | Status: DC | PRN

## 2024-07-15 MED ORDER — DROPERIDOL 2.5 MG/ML IJ SOLN: 0.6250 mg | Freq: Once | INTRAMUSCULAR | Status: DC | PRN

## 2024-07-15 MED ORDER — LACTATED RINGERS IV SOLN: INTRAVENOUS | Status: DC

## 2024-07-15 MED ORDER — ONDANSETRON HCL 4 MG/2ML IJ SOLN
INTRAMUSCULAR | Status: DC | PRN
  Administered 2024-07-15: 4 mg via INTRAVENOUS

## 2024-07-15 MED ORDER — ONDANSETRON HCL 4 MG/2ML IJ SOLN: 4.0000 mg | Freq: Four times a day (QID) | INTRAMUSCULAR | Status: DC | PRN

## 2024-07-15 MED ORDER — INSULIN ASPART 100 UNIT/ML IJ SOLN
0.0000 [IU] | INTRAMUSCULAR | Status: DC
  Administered 2024-07-15 – 2024-07-16 (×2): 2 [IU] via SUBCUTANEOUS
  Administered 2024-07-16: 3 [IU] via SUBCUTANEOUS
  Administered 2024-07-16: 2 [IU] via SUBCUTANEOUS
  Filled 2024-07-15 (×2): qty 2

## 2024-07-15 MED ORDER — PANTOPRAZOLE SODIUM 40 MG IV SOLR
40.0000 mg | Freq: Two times a day (BID) | INTRAVENOUS | Status: DC
  Administered 2024-07-15 – 2024-07-16 (×3): 40 mg via INTRAVENOUS
  Filled 2024-07-15 (×3): qty 10

## 2024-07-15 MED ORDER — LACTATED RINGERS IV BOLUS: 1000.0000 mL | Freq: Once | INTRAVENOUS | Status: DC

## 2024-07-15 MED ORDER — ONDANSETRON HCL 4 MG/2ML IJ SOLN
4.0000 mg | Freq: Once | INTRAMUSCULAR | Status: AC
  Administered 2024-07-15: 4 mg via INTRAVENOUS
  Filled 2024-07-15: qty 2

## 2024-07-15 MED ORDER — EZETIMIBE 10 MG PO TABS
10.0000 mg | ORAL_TABLET | Freq: Every day | ORAL | Status: DC
  Administered 2024-07-16: 10 mg via ORAL
  Filled 2024-07-15: qty 1

## 2024-07-15 MED ORDER — 0.9 % SODIUM CHLORIDE (POUR BTL) OPTIME
TOPICAL | Status: DC | PRN
  Administered 2024-07-15: 1000 mL

## 2024-07-15 MED ORDER — IBUPROFEN 600 MG PO TABS
600.0000 mg | ORAL_TABLET | Freq: Four times a day (QID) | ORAL | 1 refills | Status: AC | PRN
  Filled 2024-07-15: qty 120, 30d supply, fill #0

## 2024-07-15 MED ORDER — IPRATROPIUM BROMIDE 0.02 % IN SOLN: 0.5000 mg | Freq: Four times a day (QID) | RESPIRATORY_TRACT | Status: DC | PRN

## 2024-07-15 MED ORDER — LIDOCAINE 2% (20 MG/ML) 5 ML SYRINGE
INTRAMUSCULAR | Status: DC | PRN
  Administered 2024-07-15: 100 mg via INTRAVENOUS

## 2024-07-15 MED ORDER — FENTANYL CITRATE (PF) 100 MCG/2ML IJ SOLN
INTRAMUSCULAR | Status: AC
  Filled 2024-07-15: qty 2

## 2024-07-15 MED ORDER — FENTANYL CITRATE (PF) 250 MCG/5ML IJ SOLN
INTRAMUSCULAR | Status: DC | PRN
  Administered 2024-07-15: 100 ug via INTRAVENOUS

## 2024-07-15 MED ORDER — OXYCODONE HCL 5 MG PO TABS
5.0000 mg | ORAL_TABLET | ORAL | 0 refills | Status: AC | PRN
  Filled 2024-07-15: qty 15, 3d supply, fill #0

## 2024-07-15 MED ORDER — OXYCODONE HCL 5 MG PO TABS
5.0000 mg | ORAL_TABLET | ORAL | Status: DC | PRN
  Administered 2024-07-15 – 2024-07-16 (×3): 5 mg via ORAL
  Filled 2024-07-15 (×3): qty 1

## 2024-07-15 MED ORDER — SODIUM CHLORIDE 0.9 % IV SOLN
2.0000 g | Freq: Once | INTRAVENOUS | Status: AC
  Administered 2024-07-15: 2 g via INTRAVENOUS
  Filled 2024-07-15: qty 20

## 2024-07-15 MED ORDER — POLYETHYLENE GLYCOL 3350 17 GM/SCOOP PO POWD
17.0000 g | Freq: Every day | ORAL | 1 refills | Status: AC
  Filled 2024-07-15: qty 476, 28d supply, fill #0

## 2024-07-15 MED ORDER — SODIUM CHLORIDE 0.9 % IV BOLUS
1000.0000 mL | Freq: Once | INTRAVENOUS | Status: AC
  Administered 2024-07-15: 1000 mL via INTRAVENOUS

## 2024-07-15 MED ORDER — ACETAMINOPHEN 10 MG/ML IV SOLN: 1000.0000 mg | Freq: Once | INTRAVENOUS | Status: DC | PRN

## 2024-07-15 MED ORDER — MIDAZOLAM HCL (PF) 2 MG/2ML IJ SOLN
INTRAMUSCULAR | Status: DC | PRN
  Administered 2024-07-15: 2 mg via INTRAVENOUS

## 2024-07-15 MED ORDER — IOHEXOL 350 MG/ML SOLN
100.0000 mL | Freq: Once | INTRAVENOUS | Status: AC | PRN
  Administered 2024-07-15: 100 mL via INTRAVENOUS

## 2024-07-15 MED ORDER — ACETAMINOPHEN 650 MG RE SUPP: 650.0000 mg | Freq: Four times a day (QID) | RECTAL | Status: DC | PRN

## 2024-07-15 MED ORDER — SODIUM CHLORIDE 0.9 % IV SOLN
2.0000 g | INTRAVENOUS | Status: DC
  Administered 2024-07-16: 2 g via INTRAVENOUS
  Filled 2024-07-15: qty 20

## 2024-07-15 MED ORDER — CHLORHEXIDINE GLUCONATE 0.12 % MT SOLN
15.0000 mL | Freq: Once | OROMUCOSAL | Status: AC
  Administered 2024-07-15: 15 mL via OROMUCOSAL
  Filled 2024-07-15: qty 15

## 2024-07-15 MED ORDER — MORPHINE SULFATE (PF) 4 MG/ML IV SOLN
4.0000 mg | Freq: Once | INTRAVENOUS | Status: AC
  Administered 2024-07-15: 4 mg via INTRAVENOUS
  Filled 2024-07-15: qty 1

## 2024-07-15 MED ORDER — MIDAZOLAM HCL 2 MG/2ML IJ SOLN
INTRAMUSCULAR | Status: AC
  Filled 2024-07-15: qty 2

## 2024-07-15 MED ORDER — SENNOSIDES-DOCUSATE SODIUM 8.6-50 MG PO TABS: 1.0000 | ORAL_TABLET | Freq: Every evening | ORAL | Status: DC | PRN

## 2024-07-15 MED ORDER — ZOLPIDEM TARTRATE 5 MG PO TABS: 5.0000 mg | ORAL_TABLET | Freq: Every evening | ORAL | Status: DC | PRN

## 2024-07-15 MED ORDER — BUPIVACAINE-EPINEPHRINE (PF) 0.25% -1:200000 IJ SOLN
INTRAMUSCULAR | Status: AC
  Filled 2024-07-15: qty 30

## 2024-07-15 MED ORDER — ATORVASTATIN CALCIUM 80 MG PO TABS
80.0000 mg | ORAL_TABLET | Freq: Every day | ORAL | Status: DC
  Administered 2024-07-16: 80 mg via ORAL
  Filled 2024-07-15: qty 1

## 2024-07-15 MED ORDER — PROPOFOL 10 MG/ML IV BOLUS
INTRAVENOUS | Status: DC | PRN
  Administered 2024-07-15: 150 mg via INTRAVENOUS

## 2024-07-15 MED ORDER — HEPARIN SODIUM (PORCINE) 5000 UNIT/ML IJ SOLN
5000.0000 [IU] | Freq: Three times a day (TID) | INTRAMUSCULAR | Status: DC
  Administered 2024-07-15 – 2024-07-16 (×4): 5000 [IU] via SUBCUTANEOUS
  Filled 2024-07-15 (×2): qty 1

## 2024-07-15 MED ORDER — METHOCARBAMOL 750 MG PO TABS
750.0000 mg | ORAL_TABLET | Freq: Four times a day (QID) | ORAL | 1 refills | Status: AC
  Filled 2024-07-15: qty 120, 30d supply, fill #0

## 2024-07-15 NOTE — ED Provider Notes (Signed)
 " [facility] Provider Note   CSN: [id number] Arrival date & time: 07/15/24  0122     Patient presents with: Abdominal Pain   Christopher Hurley is a 49 y.o. male with a past medical history significant for hypertension, hyperlipidemia, type 2 diabetes who presents to the ED due to upper abdominal pain associated with nausea and a few episodes of nonbloody, nonbilious emesis that started yesterday evening.  Pain radiates to back.  Patient notes his abdomen was slightly distended however, improved after emesis.  Admits to excessive burping. Feels like pain is related to gas. no fever or chills.  Has had intermittent episodes of similar pain over the past few months.  Notes pain is worse after eating greasy food.  No previous abdominal operations.  Denies diarrhea.  No chest pain or shortness of breath.  Per chart review, patient had a colonoscopy 1 year ago which was unremarkable.  History obtained from patient and past medical records. No interpreter used during encounter-offered Spanish interpreter which patient declined.  Notes he speaks enough English.      Prior to Admission medications  Medication Sig Start Date End Date Taking? Authorizing Provider  aspirin  EC (ASPIRIN  LOW DOSE) 81 MG tablet Take 1 tablet (81 mg total) by mouth daily. Swallow whole. 11/29/22   Theophilus Andrews, Tully GRADE, MD  atorvastatin  (LIPITOR) 80 MG tablet Take 1 tablet (80 mg total) by mouth daily. 11/29/22   Theophilus Andrews, Tully GRADE, MD  ezetimibe  (ZETIA ) 10 MG tablet TAKE 1 TABLET BY MOUTH EVERY DAY 09/09/23   Theophilus Andrews, Tully GRADE, MD  lisinopril -hydrochlorothiazide  (ZESTORETIC ) 20-25 MG tablet TAKE 1 TABLET BY MOUTH EVERY DAY 09/09/23   Theophilus Andrews, Tully GRADE, MD  metFORMIN  (GLUCOPHAGE ) 1000 MG tablet TAKE 1 TABLET BY MOUTH TWICE A DAY WITH FOOD 09/09/23   Theophilus Andrews, Tully GRADE, MD  Multiple Vitamin (MULTIVITAMIN) capsule Take 1 capsule by mouth daily.     [provider]  Multiple Vitamins-Minerals (PRESERVISION AREDS PO) Take 1 tablet by mouth daily at 6 (six) AM.    [provider]  pantoprazole  (PROTONIX ) 40 MG tablet Take 1 tablet (40 mg total) by mouth daily. 03/21/23   Ali, Amjad, Christopher Hurley  Semaglutide ,0.25 or 0.5MG /DOS, (OZEMPIC , 0.25 OR 0.5 MG/DOSE,) 2 MG/3ML SOPN Inject 0.5 mg into the skin once a week. 05/27/23   Theophilus Andrews, Tully GRADE, MD    Allergies: Atorvastatin     Review of Systems  Respiratory:  Negative for shortness of breath.   Cardiovascular:  Negative for chest pain.  Gastrointestinal:  Positive for abdominal pain, nausea and vomiting. Negative for diarrhea.    Updated Vital Signs BP 124/82   Pulse 63   Temp 98.2 F (36.8 C)   Resp 18   SpO2 100%   Physical Exam Vitals and nursing note reviewed.  Constitutional:      General: He is not in acute distress.    Appearance: He is not ill-appearing.  HENT:     Head: Normocephalic.  Eyes:     Pupils: Pupils are equal, round, and reactive to light.  Cardiovascular:     Rate and Rhythm: Normal rate and regular rhythm.     Pulses: Normal pulses.     Heart sounds: Normal heart sounds. No murmur heard.    No friction rub. No gallop.  Pulmonary:     Effort: Pulmonary effort is normal.     Breath sounds: Normal breath sounds.  Abdominal:  General: Abdomen is flat. There is no distension.     Palpations: Abdomen is soft.     Tenderness: There is abdominal tenderness. There is no guarding or rebound.     Comments: Diffuse tenderness without rebound or guarding. Most significant in epigastric/RUQ  Musculoskeletal:        General: Normal range of motion.     Cervical back: Neck supple.  Skin:    General: Skin is warm and dry.  Neurological:     General: No focal deficit present.     Mental Status: He is alert.  Psychiatric:        Mood and Affect: Mood normal.        Behavior: Behavior normal.     (all labs ordered are listed, but  only abnormal results are displayed) Labs Reviewed  LIPASE, BLOOD - Abnormal; Notable for the following components:      Result Value   Lipase 243 (*)    All other components within normal limits  COMPREHENSIVE METABOLIC PANEL WITH GFR - Abnormal; Notable for the following components:   Glucose, Bld 207 (*)    All other components within normal limits  URINALYSIS, ROUTINE W REFLEX MICROSCOPIC - Abnormal; Notable for the following components:   Glucose, UA 50 (*)    Protein, ur 30 (*)    All other components within normal limits  CBG MONITORING, ED - Abnormal; Notable for the following components:   Glucose-Capillary 114 (*)    All other components within normal limits  EXPECTORATED SPUTUM ASSESSMENT W GRAM STAIN, RFLX TO RESP C  CBC  HIV ANTIBODY (ROUTINE TESTING W REFLEX)  MAGNESIUM  PHOSPHORUS  PRO BRAIN NATRIURETIC PEPTIDE  HEMOGLOBIN A1C  LIPID PANEL    EKG: None  Radiology: CT ABDOMEN PELVIS W CONTRAST Result Date: 07/15/2024 EXAM: CT ABDOMEN AND PELVIS WITH CONTRAST 07/15/2024 09:17:00 AM TECHNIQUE: CT of the abdomen and pelvis was performed with the administration of intravenous contrast. Multiplanar reformatted images are provided for review. Automated exposure control, iterative reconstruction, and/or weight-based adjustment of the mA/kV was utilized to reduce the radiation dose to as low as reasonably achievable. COMPARISON: CT abdomen and pelvis 03/09/2011. CLINICAL HISTORY: 49 year old male with acute severe pancreatitis, vomiting, abdominal pain, and upper back pain. FINDINGS: LOWER CHEST: Symmetric mild lung base atelectasis. LIVER AND GALLBLADDER AND BILE DUCTS: Large oblong 22 mm gallstone appears lodged in the neck of the gallbladder with moderate generalized gallbladder wall thickening and inflammation tracking from the gallbladder fossa to the porta hepatis. Subtle hyperenhancement of the adjacent right liver parenchyma, likely related to inflammation tracking from  the gallbladder. No discrete liver lesion. See coronal images. The common bile duct is nondilated. No intrahepatic ductal dilatation. SPLEEN: No acute abnormality. PANCREAS: No pancreatic ductal dilatation. No evidence of pancreatic inflammation. ADRENAL GLANDS: No acute abnormality. KIDNEYS, URETERS AND BLADDER: No stones in the kidneys or ureters. No hydronephrosis. No perinephric or periureteral stranding. Distended but otherwise unremarkable urinary bladder. GI AND BOWEL: Small mostly mesenteric fat containing hiatal hernia is new since 2012. Diverticulosis of the proximal sigmoid colon. Mildly redundant distal large bowel, with retained large bowel gas. Normal appendix on series 2 image 65 tracking into the right lower quadrant. Decompressed terminal ileum and nondilated small bowel. PERITONEUM AND RETROPERITONEUM: No ascites. No free air. VASCULATURE: Aorta is normal in caliber. Major arterial structures and portal venous system patent. LYMPH NODES: No lymphadenopathy. REPRODUCTIVE ORGANS: No acute abnormality. BONES AND SOFT TISSUES: Partially visible chronic right femur lateral plate  and screw fixation. Occasional calcified pelvic phleboliths. No acute osseous abnormality. No focal soft tissue abnormality. IMPRESSION: 1. Acute cholecystitis due to relatively large gallstone (2.2 cm) lodged in the gallbladder neck. 2. No CT evidence of pancreatic inflammation. Electronically signed by: Helayne Hurst MD 07/15/2024 09:32 AM EST RP Workstation: [id number]   DG Chest 2 View Result Date: 07/15/2024 EXAM: 2 VIEW(S) XRAY OF THE CHEST 07/15/2024 01:41:00 AM COMPARISON: None available. CLINICAL HISTORY: Back pain. FINDINGS: LUNGS AND PLEURA: Hyperinflation. No focal pulmonary opacity. No pleural effusion. No pneumothorax. HEART AND MEDIASTINUM: No acute abnormality of the cardiac and mediastinal silhouettes. BONES AND SOFT TISSUES: No acute fracture or destructive lesion. Multilevel thoracic osteophytosis. IMPRESSION:  1. No acute cardiopulmonary abnormality. Electronically signed by: Norman Gatlin MD 07/15/2024 01:49 AM EST RP Workstation: [id number]     Procedures   Medications Ordered in the ED  cefTRIAXone  (ROCEPHIN ) 2 g in sodium chloride  0.9 % 100 mL IVPB (has no administration in time range)  heparin  injection 5,000 Units (has no administration in time range)  sodium chloride  flush (NS) 0.9 % injection 3 mL (3 mLs Intravenous Given 07/15/24 1158)  0.9 %  sodium chloride  infusion (has no administration in time range)  sodium chloride  flush (NS) 0.9 % injection 3 mL (3 mLs Intravenous Given 07/15/24 1158)  acetaminophen  (TYLENOL ) tablet 650 mg (has no administration in time range)    Or  acetaminophen  (TYLENOL ) suppository 650 mg (has no administration in time range)  oxyCODONE  (Oxy IR/ROXICODONE ) immediate release tablet 5 mg (has no administration in time range)  HYDROmorphone  (DILAUDID ) injection 0.5-1 mg (has no administration in time range)  zolpidem  (AMBIEN ) tablet 5 mg (has no administration in time range)  senna-docusate (Senokot-S) tablet 1 tablet (has no administration in time range)  sodium phosphate (FLEET) enema 1 enema (has no administration in time range)  ondansetron  (ZOFRAN ) tablet 4 mg (has no administration in time range)    Or  ondansetron  (ZOFRAN ) injection 4 mg (has no administration in time range)  ipratropium (ATROVENT ) nebulizer solution 0.5 mg (has no administration in time range)  hydrALAZINE  (APRESOLINE ) injection 10 mg (has no administration in time range)  insulin  aspart (novoLOG ) injection 0-15 Units ( Subcutaneous Not Given 07/15/24 1157)  pantoprazole  (PROTONIX ) injection 40 mg (has no administration in time range)  atorvastatin  (LIPITOR) tablet 80 mg (has no administration in time range)  ezetimibe  (ZETIA ) tablet 10 mg (has no administration in time range)  sodium chloride  0.9 % bolus 1,000 mL (0 mLs Intravenous Stopped 07/15/24 1019)  ondansetron  (ZOFRAN ) injection 4  mg (4 mg Intravenous Given 07/15/24 0858)  morphine  (PF) 4 MG/ML injection 4 mg (4 mg Intravenous Given 07/15/24 0859)  iohexol  (OMNIPAQUE ) 350 MG/ML injection 100 mL (100 mLs Intravenous Contrast Given 07/15/24 0918)  cefTRIAXone  (ROCEPHIN ) 2 g in sodium chloride  0.9 % 100 mL IVPB (0 g Intravenous Stopped 07/15/24 1048)    Clinical Course as of 07/15/24 1202  Wed Jul 15, 2024  0819 Lipase(!): 243 [CA]    Clinical Course User Index [CA] Christopher Aleck BROCKS, Christopher Hurley                                 Medical Decision Making Amount and/or Complexity of Data Reviewed External Data Reviewed: notes.    Details: Reviewed recent UC note Labs: ordered. Decision-making details documented in ED Course. Radiology: ordered and independent interpretation performed. Decision-making details documented in ED Course. ECG/medicine tests: ordered  and independent interpretation performed. Decision-making details documented in ED Course.  Risk Prescription drug management. Decision regarding hospitalization.   This patient presents to the ED for concern of abdominal pain, this involves an extensive number of treatment options, and is a complaint that carries with it a high risk of complications and morbidity.  The differential diagnosis includes pancreatitis, acute cholecystitis, bowel obstruction, gastroenteritis, ACS, etc  49 year old male presents to the ED due to upper abdominal pain associated with nausea and vomiting that started last night.  Has had intermittent episodes of abdominal pain after eating greasy foods over the past few months.  Upon arrival patient afebrile, not tachycardic or hypoxic.  Patient well-appearing on exam.  Unfortunately patient waited over 7 hours prior to my Christopher Hurley valuation due to long wait times.  Abdomen soft, nondistended with diffuse tenderness most significant in upper quadrants.  No rebound or guarding.  Labs ordered in triage.  Added CT abdomen and EKG to rule out atypical ACS  however, suspicion is lower.  IV fluids, Zofran , and morphine  given.  CBC with no leukocytosis.  Normal hemoglobin.  CMP with hyperglycemia 207.  Normal anion gap.  Low suspicion for DKA.  Normal renal function.  No major electrolyte derangements.  Lipase elevated 243.  Patient notes he decreased his drinking since December.  Does not drink nightly.  UA with proteinuria.  No evidence of infection.  Chest x-ray personally reviewed and interpreted which negative for signs of pneumonia, pneumothorax, widened mediastinum.  CT abdomen demonstrates acute cholecystitis with a large gallstone lodged in gallbladder neck measuring 2.2 cm. IV antibiotics started.   10:00 AM Discussed with Christopher Hurley with general surgery who will see patient. Last ate at 4AM.   General surgery requesting admission to medicine. Surgery will see patient.   11:37 AM Discussed with Christopher Hurley with [facility] who agrees to admit patient  Co morbidities that complicate the patient evaluation  HTN, HL, DM  Social Determinants of Health:  Lives independently  Test / Admission - Considered:  Admit due to acute cholecystitis       Final diagnoses:  Acute cholecystitis  Nausea and vomiting, unspecified vomiting type  Elevated lipase    ED Discharge Orders     None          Christopher Hurley 07/15/24 1203    Christopher Longs, MD 07/15/24 2251  "

## 2024-07-15 NOTE — Assessment & Plan Note (Addendum)
 Acute cholecystitis, cholelithiasis - General Surgery consulted - Keeping patient NPO - Continue IV fluid hydration, IV antibiotics - Monitoring labs, LFT, lipase  Lipase of 243, AST 23, ALT 27, T. bili 0.4,  CT ABDOMEN/PELVIS: 1. Acute cholecystitis due to relatively large gallstone (2.2 cm) lodged in the gallbladder neck. 2. No CT evidence of pancreatic inflammation.

## 2024-07-15 NOTE — ED Notes (Signed)
 Patient transported to CT

## 2024-07-15 NOTE — Op Note (Signed)
" ° °  Operative Note  Date: 07/15/2024  Procedure: laparoscopic cholecystectomy  Pre-op diagnosis: acute cholecystitis Post-op diagnosis: Acute calculous cholecystitis  Indication and clinical history: The patient is a 49 y.o. year old male with acute cholecystitis  Surgeon: Dreama GEANNIE Hanger, MD  Anesthesiologist: Boone, MD Anesthesia: General  Findings:  Specimen: gallbladder EBL: 5cc Drains/Implants: none  Disposition: PACU - hemodynamically stable.  Description of procedure: The patient was positioned supine on the operating room table. Time-out was performed verifying correct patient, procedure, signature of informed consent, and administration of pre-operative antibiotics. General anesthetic induction and intubation were uneventful. The abdomen was prepped and draped in the usual sterile fashion. An infra-umbilical incision was made using an open technique using zero vicryl stay sutures on either side of the fascia and a 10mm Hassan port inserted. After establishing pneumoperitoneum, which the patient tolerated well, the abdominal cavity was inspected and no injury of any intra-abdominal structures was identified. Additional ports were placed under direct visualization and using local anesthetic: two 5mm ports in the right subcostal region and a 5mm port in the epigastric region. The patient was re-positioned to reverse Trendelenburg and right side up. Adhesiolysis was performed to expose the gallbladder, which was then retracted cephalad. The infundibulum was identified and retracted toward the right lower quadrant. The peritoneum was incised over the infundibulum and the triangle of Calot dissected to expose the critical view of safety. With clear identification and isolation of the cystic duct and cystic artery, the cystic artery was doubly clipped and divided. After this, the cystic duct was identified as a single structure entering the gallbladder, and was also doubly clipped and divided.  The gallbladder was dissected off the liver bed using electrocautery and hemostasis of the liver bed was confirmed prior to separation of the final peritoneal attachments of the gallbladder to the liver bed. The gallbladder fossa was irrigated and fluid returned clear. After transection of the final peritoneal attachments, the gallbladder was placed in an endoscopic specimen retrieval bag, removed via the umbilical port site, and sent to pathology as a permanent specimen. The gallbladder fossa was inspected confirming hemostasis, the absence of bile leakage from the cystic duct stump, and correct placement of clips on the cystic artery and cystic duct stumps. The abdomen was desufflated and the fascia of the umbilical port site was closed using the previously placed stay sutures. Additional local anesthetic was administered at the umbilical port site.  The skin of all incisions was closed with 4-0 monocryl. Sterile dressings were applied. All sponge and instrument counts were correct at the conclusion of the procedure. The patient was awakened from anesthesia, extubated uneventfully, and transported to the PACU - hemodynamically stable.. There were no complications.   Upon entering the abdomen (organ space), I encountered infection of the gallbladder.  CASE DATA:  Type of patient?: DOW CASE (Surgical Hospitalist [facility])  Status of Case? URGENT Add On  Infection Present At Time Of Surgery (PATOS)?  INFECTION of the gallbladder    Dreama GEANNIE Hanger, MD General and Trauma Surgery [facility] Surgery  "

## 2024-07-15 NOTE — ED Triage Notes (Signed)
 Patient reports pain across abdomen with emesis this evening , he adds intermittent upper back pain . No diarrhea or fever .

## 2024-07-15 NOTE — H&P (Signed)
 " History and Physical   Patient: Christopher Hurley                            PCP: Pcp, No                    DOB: [birth year]            DOA: 07/15/2024 FMW:[id number]             DOS: 07/15/2024, 12:02 PM  Pcp, No  Patient coming from:   HOME  I have personally reviewed patient's medical records, in electronic medical records, including:  [place] [facility], and care everywhere.    Chief Complaint:   Chief Complaint  Patient presents with   Abdominal Pain    History of present illness:    Christopher Hurley 49 year old male with extensive history of DM II, HTN, HLD.SABRA Presents to ED with chief complaint of abdominal pain. Onset of symptoms yesterday evening,  associated with nausea, vomiting, none bilious or bloody vomitus.  Pain is mainly in upper abdomen, epigastric area, with no radiation. Denies of any fever, chills.  Denies of any diarrhea or constipation.  Denies any shortness of breath or chest pain.   ED Evaluation: Blood pressure (!) 103/58, pulse 70, temperature 97.9 F (36.6 C), temperature source Oral, resp. rate 18, SpO2 100%. LABs: All reviewed LFTs within normal limits with exception of lipase of 243, glucose 207,  CT ABDOMEN/PELVIS: 1. Acute cholecystitis due to relatively large gallstone (2.2 cm) lodged in the gallbladder neck. 2. No CT evidence of pancreatic inflammation.    Patient Denies having: Fever, Chills, Cough, SOB, Chest Pain, Abd pain, N/V/D, headache, dizziness, lightheadedness,  Dysuria, Joint pain, rash, open wounds    Review of Systems: As per HPI, otherwise 10 point review of systems were negative.   ----------------------------------------------------------------------------------------------------------------------  Allergies[1]  Home MEDs:  Prior to Admission medications  Medication Sig Start Date End Date Taking? Authorizing Provider  aspirin  EC (ASPIRIN  LOW DOSE) 81 MG tablet Take 1 tablet (81 mg total) by mouth daily. Swallow  whole. 11/29/22   Theophilus Andrews, Tully GRADE, MD  atorvastatin  (LIPITOR) 80 MG tablet Take 1 tablet (80 mg total) by mouth daily. 11/29/22   Theophilus Andrews, Tully GRADE, MD  ezetimibe  (ZETIA ) 10 MG tablet TAKE 1 TABLET BY MOUTH EVERY DAY 09/09/23   Theophilus Andrews, Tully GRADE, MD  lisinopril -hydrochlorothiazide  (ZESTORETIC ) 20-25 MG tablet TAKE 1 TABLET BY MOUTH EVERY DAY 09/09/23   Theophilus Andrews, Tully GRADE, MD  metFORMIN  (GLUCOPHAGE ) 1000 MG tablet TAKE 1 TABLET BY MOUTH TWICE A DAY WITH FOOD 09/09/23   Theophilus Andrews, Tully GRADE, MD  Multiple Vitamin (MULTIVITAMIN) capsule Take 1 capsule by mouth daily.    [provider]  Multiple Vitamins-Minerals (PRESERVISION AREDS PO) Take 1 tablet by mouth daily at 6 (six) AM.    [provider]  pantoprazole  (PROTONIX ) 40 MG tablet Take 1 tablet (40 mg total) by mouth daily. 03/21/23   Hildegard, Amjad, PA-C  Semaglutide ,0.25 or 0.5MG /DOS, (OZEMPIC , 0.25 OR 0.5 MG/DOSE,) 2 MG/3ML SOPN Inject 0.5 mg into the skin once a week. 05/27/23   Theophilus Andrews Tully GRADE, MD    PRN MEDs: acetaminophen  **OR** acetaminophen , hydrALAZINE , HYDROmorphone  (DILAUDID ) injection, ipratropium, ondansetron  **OR** ondansetron  (ZOFRAN ) IV, oxyCODONE , senna-docusate, sodium phosphate, zolpidem   Past Medical History:  Diagnosis Date   Diabetes mellitus without complication (HCC)    on meds   Hyperlipidemia  on meds   Hypertension    on meds   Seasonal allergies    Ventral hernia     Past Surgical History:  Procedure Laterality Date   TIBIA FRACTURE SURGERY Right 1992     reports that he has never smoked. He has never used smokeless tobacco. He reports current alcohol use of about 7.0 standard drinks of alcohol per week. He reports that he does not use drugs.   Family History  Problem Relation Age of Onset   Diabetes Mother    Diabetes Father    Colon cancer Neg Hx    Colon polyps Neg Hx    Esophageal cancer Neg Hx    Rectal cancer Neg Hx    Stomach  cancer Neg Hx     Physical Exam:   Vitals:   07/15/24 0127 07/15/24 0507 07/15/24 0913 07/15/24 1201  BP: (!) 159/90 125/81 (!) 103/58 124/82  Pulse: 65 73 70 63  Resp: 18 16 18 18   Temp: 97.8 F (36.6 C) 98.4 F (36.9 C) 97.9 F (36.6 C) 98.2 F (36.8 C)  TempSrc: Oral  Oral   SpO2: 100% 100% 100% 100%   Constitutional: NAD, calm, comfortable Eyes: PERRL, lids and conjunctivae normal ENMT: Mucous membranes are moist. Posterior pharynx clear of any exudate or lesions.Normal dentition.  Neck: normal, supple, no masses, no thyromegaly Respiratory: clear to auscultation bilaterally, no wheezing, no crackles. Normal respiratory effort. No accessory muscle use.  Cardiovascular: Regular rate and rhythm, no murmurs / rubs / gallops. No extremity edema. 2+ pedal pulses. No carotid bruits.  Abdomen: Tenderness to deep palpation in right upper quadrant,  no masses palpated. No hepatosplenomegaly. Bowel sounds positive.  Musculoskeletal: no clubbing / cyanosis. No joint deformity upper and lower extremities. Good ROM, no contractures. Normal muscle tone.  Neurologic: CN II-XII grossly intact. Sensation intact, DTR normal. Strength 5/5 in all 4.  Psychiatric: Normal judgment and insight. Alert and oriented x 3. Normal mood.  Skin: no rashes, lesions, ulcers. No induration          Labs on admission:    I have personally reviewed following labs and imaging studies  CBC: Recent Labs  Lab 07/15/24 0132  WBC 9.6  HGB 14.2  HCT 40.3  MCV 93.1  PLT 202   Basic Metabolic Panel: Recent Labs  Lab 07/15/24 0132  NA 136  K 3.7  CL 99  CO2 25  GLUCOSE 207*  BUN 16  CREATININE 0.73  CALCIUM  9.6   GFR: CrCl cannot be calculated (Unknown ideal weight.). Liver Function Tests: Recent Labs  Lab 07/15/24 0132  AST 23  ALT 27  ALKPHOS 86  BILITOT 0.4  PROT 7.3  ALBUMIN 4.6   Recent Labs  Lab 07/15/24 0132  LIPASE 243*    Urine analysis:    Component Value  Date/Time   COLORURINE YELLOW 07/15/2024 0137   APPEARANCEUR CLEAR 07/15/2024 0137   LABSPEC 1.020 07/15/2024 0137   PHURINE 5.0 07/15/2024 0137   GLUCOSEU 50 (A) 07/15/2024 0137   HGBUR NEGATIVE 07/15/2024 0137   BILIRUBINUR NEGATIVE 07/15/2024 0137   KETONESUR NEGATIVE 07/15/2024 0137   PROTEINUR 30 (A) 07/15/2024 0137   UROBILINOGEN 0.2 01/06/2015 1310   NITRITE NEGATIVE 07/15/2024 0137   LEUKOCYTESUR NEGATIVE 07/15/2024 0137    Last A1C:  Lab Results  Component Value Date   HGBA1C 7.8 (A) 05/13/2023     Radiologic Exams on Admission:   CT ABDOMEN PELVIS W CONTRAST Result Date: 07/15/2024 EXAM: CT ABDOMEN  AND PELVIS WITH CONTRAST 07/15/2024 09:17:00 AM TECHNIQUE: CT of the abdomen and pelvis was performed with the administration of intravenous contrast. Multiplanar reformatted images are provided for review. Automated exposure control, iterative reconstruction, and/or weight-based adjustment of the mA/kV was utilized to reduce the radiation dose to as low as reasonably achievable. COMPARISON: CT abdomen and pelvis 03/09/2011. CLINICAL HISTORY: 49 year old male with acute severe pancreatitis, vomiting, abdominal pain, and upper back pain. FINDINGS: LOWER CHEST: Symmetric mild lung base atelectasis. LIVER AND GALLBLADDER AND BILE DUCTS: Large oblong 22 mm gallstone appears lodged in the neck of the gallbladder with moderate generalized gallbladder wall thickening and inflammation tracking from the gallbladder fossa to the porta hepatis. Subtle hyperenhancement of the adjacent right liver parenchyma, likely related to inflammation tracking from the gallbladder. No discrete liver lesion. See coronal images. The common bile duct is nondilated. No intrahepatic ductal dilatation. SPLEEN: No acute abnormality. PANCREAS: No pancreatic ductal dilatation. No evidence of pancreatic inflammation. ADRENAL GLANDS: No acute abnormality. KIDNEYS, URETERS AND BLADDER: No stones in the kidneys or ureters. No  hydronephrosis. No perinephric or periureteral stranding. Distended but otherwise unremarkable urinary bladder. GI AND BOWEL: Small mostly mesenteric fat containing hiatal hernia is new since 2012. Diverticulosis of the proximal sigmoid colon. Mildly redundant distal large bowel, with retained large bowel gas. Normal appendix on series 2 image 65 tracking into the right lower quadrant. Decompressed terminal ileum and nondilated small bowel. PERITONEUM AND RETROPERITONEUM: No ascites. No free air. VASCULATURE: Aorta is normal in caliber. Major arterial structures and portal venous system patent. LYMPH NODES: No lymphadenopathy. REPRODUCTIVE ORGANS: No acute abnormality. BONES AND SOFT TISSUES: Partially visible chronic right femur lateral plate and screw fixation. Occasional calcified pelvic phleboliths. No acute osseous abnormality. No focal soft tissue abnormality. IMPRESSION: 1. Acute cholecystitis due to relatively large gallstone (2.2 cm) lodged in the gallbladder neck. 2. No CT evidence of pancreatic inflammation. Electronically signed by: Helayne Hurst MD 07/15/2024 09:32 AM EST RP Workstation: [id number]   DG Chest 2 View Result Date: 07/15/2024 EXAM: 2 VIEW(S) XRAY OF THE CHEST 07/15/2024 01:41:00 AM COMPARISON: None available. CLINICAL HISTORY: Back pain. FINDINGS: LUNGS AND PLEURA: Hyperinflation. No focal pulmonary opacity. No pleural effusion. No pneumothorax. HEART AND MEDIASTINUM: No acute abnormality of the cardiac and mediastinal silhouettes. BONES AND SOFT TISSUES: No acute fracture or destructive lesion. Multilevel thoracic osteophytosis. IMPRESSION: 1. No acute cardiopulmonary abnormality. Electronically signed by: Norman Gatlin MD 07/15/2024 01:49 AM EST RP Workstation: [id number]    EKG:   Independently reviewed.  Orders placed or performed during the hospital encounter of 07/15/24   ED EKG   ED EKG   EKG 12-Lead    ---------------------------------------------------------------------------------------------------------------------------------------    Assessment / Plan:   Principal Problem:   Acute cholecystitis Active Problems:   DM (diabetes mellitus), type 2 (HCC)   Gastroesophageal reflux disease   Hyperlipidemia   Benign essential hypertension   Assessment and Plan: * Acute cholecystitis Acute cholecystitis, cholelithiasis - General Surgery consulted - Keeping patient NPO - Continue IV fluid hydration, IV antibiotics - Monitoring labs, LFT, lipase  Lipase of 243, AST 23, ALT 27, T. bili 0.4,  CT ABDOMEN/PELVIS: 1. Acute cholecystitis due to relatively large gallstone (2.2 cm) lodged in the gallbladder neck. 2. No CT evidence of pancreatic inflammation.  Gastroesophageal reflux disease Starting IV Protonix  40 mg twice daily  DM (diabetes mellitus), type 2 (HCC) - N.p.o. -Checking CBG every 4 hours, SSI coverage -Home medication of semaglutide /Ozempic  reviewed -q. weekly injection may be resumed -  Recheck an A1c (last was 7.8)  Benign essential hypertension Blood pressure stable -Continue as needed IV hydralazine , -Once tolerating p.o., continue lisinopril /HCTZ  Hyperlipidemia When tolerating p.o., continue Zetia  and atorvastatin      Consults called: General Surgery -------------------------------------------------------------------------------------------------------------------------------------------- DVT prophylaxis:  heparin  injection 5,000 Units Start: 07/15/24 1400 SCDs Start: 07/15/24 1143 Place TED hose Start: 07/15/24 1143   Code Status:   Code Status: Full Code   Admission status: Patient will be admitted as Inpatient, with a greater than 2 midnight length of stay. Level of care: Med-Surg   Family Communication:  none at bedside  (The above findings and plan of care has been discussed with patient in detail, the patient expressed understanding  and agreement of above plan)  --------------------------------------------------------------------------------------------------------------------------------------------------  Disposition Plan:  Anticipated 1-2 days Status is: Inpatient Remains inpatient appropriate because: Needing IV antibiotics, possibly surgical intervention     ----------------------------------------------------------------------------------------------------------------------------------------------------  Time spent:  87  Min.  Was spent seeing and evaluating the patient, reviewing all medical records, drawn plan of care.  SIGNED: Adriana DELENA Grams, MD, FHM. FAAFP. [facility], Pager  (Please use [email or web address] to page/ or secure chat through epic) If 7PM-7AM, please contact night-coverage [email or web address],  07/15/2024, 12:02 PM     [1]  Allergies Allergen Reactions   Atorvastatin  Nausea And Vomiting    NOT ALLERGIC**Atorvastatin  caused nausea / vomiting but also metformin  was started at the same time for DM2 causing diarrhea x 1 week . Most likely GI side effects were due to metformin    "

## 2024-07-15 NOTE — Anesthesia Postprocedure Evaluation (Signed)
"   Anesthesia Post Note  Patient: Christopher Hurley  Procedure(s) Performed: LAPAROSCOPIC CHOLECYSTECTOMY (Abdomen)     Patient location during evaluation: PACU Anesthesia Type: General Level of consciousness: awake and alert Pain management: pain level controlled Vital Signs Assessment: post-procedure vital signs reviewed and stable Respiratory status: spontaneous breathing, nonlabored ventilation, respiratory function stable and patient connected to nasal cannula oxygen Cardiovascular status: blood pressure returned to baseline and stable Postop Assessment: no apparent nausea or vomiting Anesthetic complications: no   There were no known notable events for this encounter.  Last Vitals:  Vitals:   07/15/24 1541 07/15/24 1545  BP: (!) 144/79 (!) 133/90  Pulse: 88 87  Resp: 14 16  Temp: 37.4 C   SpO2: 100% 100%    Last Pain:  Vitals:   07/15/24 1332  TempSrc: Oral  PainSc:                  Rome Ade      "

## 2024-07-15 NOTE — Discharge Instructions (Addendum)
 [facility]  CIRUGA LAPAROSCPICA: INSTRUCCIONES POST OPERACIONALES Revise siempre la hoja de instrucciones de alta que le entreg el centro donde se realiz la ciruga. SI TIENE FORMULARIOS DE LICENCIA POR DISCAPACIDAD O FAMILIAR, DEBE TRAERLOS A LA OFICINA PARA SU PROCESAMIENTO. NO SE LOS D A SU MDICO.  CONTROL DE DOLOR  1. Rgimen del dolor: tome tylenol  (acetaminofn) de venta libre 1000 mg cada seis horas, ibuprofeno recetado (600 mg) cada seis horas y robaxina (metocarbamol) 750 mg cada seis horas. Con los tres, deberas tomar the sherwin-williams. Ejemplo: tylenol  (paracetamol) a las 8 a.m., ibuprofeno a las 10 a.m., robaxina (metocarbamol) a las 12 p.m., tylenol  (acetaminofn) nuevamente a las 2 p.m., ibuprofeno nuevamente a las 4 p.m., robaxina (metocarbamol) a las 6 p.m. Tambin tiene receta mdica para oxicodona, que debe tomar si el tylenol  (acetaminofn), el ibuprofeno y la robaxina (metocarbamol) no son suficientes para human resources officer. Puede tomar oxicodona con tanta frecuencia como cada cuatro horas, segn sea necesario, pero si est tomando los otros medicamentos como se indic anteriormente, no debera necesitar oxicodona con tanta frecuencia. Tambin le han recetado Miralax , que es un ablandador de heces. Tmelo segn lo prescrito porque la oxicodona puede causar estreimiento y Miralax  minimizar o chief strategy officer. No conduzca mientras est tomando o bajo la influencia de oxicodona, ya que es un medicamento narctico. 2. Utilice compresas de hielo para ayudar a human resources officer. 3. Si necesita un resurtido de su analgsico, comunquese con su farmacia. Se comunicarn con nuestra oficina para solicitar autorizacin. Las recetas no se surtirn despus de las 5 p. m. ni los fines de [place].  MEDICAMENTOS DOMSTICOS 4. Tome los medicamentos que le recetan habitualmente, a menos que se le indique lo contrario.  DIETA 5. Conviene seguir una  dieta ligera los primeros das tras la llegada a casa. Asegrese de incluir muchos lquidos diariamente. 6. No consuma alcohol mientras est tomando oxicodona o ibuprofeno.  CONSTIPACIN 7. Es comn experimentar algo de estreimiento despus de ignacia ciruga y si est tomando analgsicos. El estreimiento administrator, arts abdominal, por lo que es mejor prevenirlo aumentando la ingesta de lquidos y tomando un ablandador de health visitor. Ya le han recetado un laxante suave, Miralax , que debe tomar una vez al da. Puede aumentar la dosis de Miralax  a dos o incluso tres veces al da hasta que tenga una evacuacin intestinal. Si 24 horas despus de tomar Miralax  tres veces al da sigue sin evacuar, puede probar con citrato de magnesio, disponible sin receta en su farmacia local.  CUIDADO DE HERIDAS/INCISIONES 8. La mayora de los pacientes experimentarn algo de hinchazn y hematomas en el rea de las incisiones. Las bolsas de hielo ayudarn. La hinchazn y los hematomas pueden tardar aetna. 9. Lluvia de mayo a partir de 5 de Febrero 2026. 10. No retire ni frote el pegamento para la piel. Puede permitir que agua tibia y jabn corra sobre la incisin, luego enjuagar y haematologist. 11. No se sumerja en agua (baeras, jacuzzis, piscinas, lagos, ocanos) durante c[employer].  ACTIVIDADES 12. Puede reanudar las actividades diarias habituales (ligeras) a glass blower/designer del da siguiente, como el cuidado personal diario, advertising account planner, subir escaleras, aumentando gradualmente las actividades segn las Reas[place]. Puede tener relaciones sexuales cuando le resulte cmodo. 13. No levantar ms de 5 libras durante seis semanas. 14. Puede conducir cuando ya no est tomando analgsicos narcticos, puede usar cmodamente el cinturn de seguridad y puede maniobrar su automvil y cablevision systems  frenos con seguridad.  HACER UN SEGUIMIENTO 15. Debe consultar a su mdico en el consultorio para una cita de seguimiento aproximadamente 2  a 3 semanas despus de la ciruga. Se le debera haber dado su cita postoperatoria/de seguimiento cuando se program su ciruga. Si no recibi una cita postoperatoria/de seguimiento, asegrese de llamar para programar esta cita dentro de uno o [employer] de llegar a casa para asegurar una cita conveniente.  CUNDO LLAMAR A SU MDICO: 1. Fiebre superior a 101,5 2. Incapacidad para orinar 3. Sangrado continuo de la incisin. 4. Aumento del dolor, enrojecimiento o drenaje de la incisin. 5. Dolor abdominal creciente  El personal de la clnica est disponible para responder sus preguntas durante el horario comercial habitual. No dude en llamar y pedir hablar con una de las enfermeras si tiene inquietudes clnicas. Si tiene kelly services, vaya a la sala de emergencias ms cercana o llame al 911. Rolanda rutha everitt [facility] siempre est de [facility].  [address], [address], [place], [place]  [address] ? [address] [address], [place], [place]   [address] [phone] ? [phone] ? FAX [phone] Web site: [email or web address]

## 2024-07-15 NOTE — H&P (Signed)
 "       H&P Note  Rydan Gulyas [facility] 10-14-1975  [id number].    Requesting MD:  Dr. Adriana Grams, MD  Chief Complaint/Reason for Consult:  RUQ pain  HPI:  Christopher Hurley is a 49 year old male with a PMH of T2DM, HTN, HLD that presented to the ED due to abdominal pain. Patient states that the pain started last night around 9:00PM and was present of his RUQ radiating to his back. He states that he has had similar episodes over the past several months after eating fatty foods but that normally the pain subsides. Patient has had associated nausea and vomiting. The last episode of emesis was 4:00AM. His last BM was yesterday. His last PO intake was at 4:00AM.  Patient's work up in the ED included labs and imaging: -Lipase 243 -CMP with only abnormal glucose 207. Normal LFTs -CBC WNL -Hemoglobin A1C 7.3 -UA with glucose and protein  CT imaging showed: Acute cholecystitis due to relatively large gallstone (2.2 cm) lodged in the gallbladder neck. No CT evidence of pancreatic inflammation.  Surgical history: Denies Anticoagulation history: Takes low dose aspirin  but has not taken this in at least a month Colonoscopy: 2025 Tobacco use: Denies Alcohol use: Denies Illicit drug use: Denies Allergies: Atorvastatin  (n/v)   ROS: Per HPI  Family History  Problem Relation Age of Onset   Diabetes Mother    Diabetes Father    Colon cancer Neg Hx    Colon polyps Neg Hx    Esophageal cancer Neg Hx    Rectal cancer Neg Hx    Stomach cancer Neg Hx     Past Medical History:  Diagnosis Date   Diabetes mellitus without complication (HCC)    on meds   Hyperlipidemia    on meds   Hypertension    on meds   Seasonal allergies    Ventral hernia     Past Surgical History:  Procedure Laterality Date   TIBIA FRACTURE SURGERY Right 1992    Social History:  reports that he has never smoked. He has never used smokeless tobacco. He reports current alcohol use of about 7.0 standard drinks  of alcohol per week. He reports that he does not use drugs.  Allergies: Allergies[1]  Medications Prior to Admission  Medication Sig Dispense Refill   aspirin  EC (ASPIRIN  LOW DOSE) 81 MG tablet Take 1 tablet (81 mg total) by mouth daily. Swallow whole. 90 tablet 3   atorvastatin  (LIPITOR) 80 MG tablet Take 1 tablet (80 mg total) by mouth daily. 90 tablet 1   ezetimibe  (ZETIA ) 10 MG tablet TAKE 1 TABLET BY MOUTH EVERY DAY 90 tablet 1   lisinopril -hydrochlorothiazide  (ZESTORETIC ) 20-25 MG tablet TAKE 1 TABLET BY MOUTH EVERY DAY 90 tablet 1   metFORMIN  (GLUCOPHAGE ) 1000 MG tablet TAKE 1 TABLET BY MOUTH TWICE A DAY WITH FOOD 180 tablet 1   Multiple Vitamin (MULTIVITAMIN) capsule Take 1 capsule by mouth daily.     Multiple Vitamins-Minerals (PRESERVISION AREDS PO) Take 1 tablet by mouth daily at 6 (six) AM.     pantoprazole  (PROTONIX ) 40 MG tablet Take 1 tablet (40 mg total) by mouth daily. 30 tablet 0   Semaglutide ,0.25 or 0.5MG /DOS, (OZEMPIC , 0.25 OR 0.5 MG/DOSE,) 2 MG/3ML SOPN Inject 0.5 mg into the skin once a week. 3 mL 2    Blood pressure 124/82, pulse 63, temperature 98.2 F (36.8 C), resp. rate 18, SpO2 100%. Physical Exam:  General: Pleasant male who is laying in bed  in NAD. HEENT: Head is normocephalic, atraumatic.  Sclera are noninjected. EOMI.  Ears and nose without any masses or lesions.  Mouth is pink and moist. Heart: HR normal during encounter.  Lungs: Respiratory effort nonlabored on room air. Abd: Soft, ND. Tenderness to palpation of RUQ. +BS. + Murphy's sign MS: Able to move all 4 extremities. Skin: Warm and dry.  Psych: A&Ox3 with an appropriate affect.   Results for orders placed or performed during the hospital encounter of 07/15/24 (from the past 48 hours)  Lipase, blood     Status: Abnormal   Collection Time: 07/15/24  1:32 AM  Result Value Ref Range   Lipase 243 (H) 11 - 51 U/L    Comment: Performed at [facility], [address] [place], [place]  [address]  Comprehensive metabolic panel     Status: Abnormal   Collection Time: 07/15/24  1:32 AM  Result Value Ref Range   Sodium 136 135 - 145 mmol/L   Potassium 3.7 3.5 - 5.1 mmol/L   Chloride 99 98 - 111 mmol/L   CO2 25 22 - 32 mmol/L   Glucose, Bld 207 (H) 70 - 99 mg/dL    Comment: Glucose reference range applies only to samples taken after fasting for at least 8 hours.   BUN 16 6 - 20 mg/dL   Creatinine, Ser 9.26 0.61 - 1.24 mg/dL   Calcium  9.6 8.9 - 10.3 mg/dL   Total Protein 7.3 6.5 - 8.1 g/dL   Albumin 4.6 3.5 - 5.0 g/dL   AST 23 15 - 41 U/L    Comment: HEMOLYSIS AT THIS LEVEL MAY AFFECT RESULT   ALT 27 0 - 44 U/L   Alkaline Phosphatase 86 38 - 126 U/L   Total Bilirubin 0.4 0.0 - 1.2 mg/dL   GFR, Estimated >39 >39 mL/min    Comment: (NOTE) Calculated using the CKD-EPI Creatinine Equation (2021)    Anion gap 13 5 - 15    Comment: Performed at [facility], [address], [place], [place] [address]  CBC     Status: None   Collection Time: 07/15/24  1:32 AM  Result Value Ref Range   WBC 9.6 4.0 - 10.5 K/uL   RBC 4.33 4.22 - 5.81 MIL/uL   Hemoglobin 14.2 13.0 - 17.0 g/dL   HCT 59.6 60.9 - 47.9 %   MCV 93.1 80.0 - 100.0 fL   MCH 32.8 26.0 - 34.0 pg   MCHC 35.2 30.0 - 36.0 g/dL   RDW 87.3 88.4 - 84.4 %   Platelets 202 150 - 400 K/uL   nRBC 0.0 0.0 - 0.2 %    Comment: Performed at [facility], [address] [place], [place] [address]  Hemoglobin A1c     Status: Abnormal   Collection Time: 07/15/24  1:32 AM  Result Value Ref Range   Hgb A1c MFr Bld 7.3 (H) 4.8 - 5.6 %    Comment: (NOTE) Diagnosis of Diabetes The following HbA1c ranges recommended by the American Diabetes Association (ADA) may be used as an aid in the diagnosis of diabetes mellitus.  Hemoglobin             Suggested A1C NGSP%              Diagnosis  <5.7                   Non Diabetic  5.7-6.4  Pre-Diabetic  >6.4                   Diabetic  <7.0                    Glycemic control for                       adults with diabetes.     Mean Plasma Glucose 162.81 mg/dL    Comment: Performed at [facility], [address], [place], [place] [address]  Urinalysis, Routine w reflex microscopic -Urine, Clean Catch     Status: Abnormal   Collection Time: 07/15/24  1:37 AM  Result Value Ref Range   Color, Urine YELLOW YELLOW   APPearance CLEAR CLEAR   Specific Gravity, Urine 1.020 1.005 - 1.030   pH 5.0 5.0 - 8.0   Glucose, UA 50 (A) NEGATIVE mg/dL   Hgb urine dipstick NEGATIVE NEGATIVE   Bilirubin Urine NEGATIVE NEGATIVE   Ketones, ur NEGATIVE NEGATIVE mg/dL   Protein, ur 30 (A) NEGATIVE mg/dL   Nitrite NEGATIVE NEGATIVE   Leukocytes,Ua NEGATIVE NEGATIVE   RBC / HPF 0-5 0 - 5 RBC/hpf   WBC, UA 0-5 0 - 5 WBC/hpf   Bacteria, UA NONE SEEN NONE SEEN   Squamous Epithelial / HPF 0-5 0 - 5 /HPF   Mucus PRESENT     Comment: Performed at [facility], [address], [place], [place] [address]  Magnesium     Status: Abnormal   Collection Time: 07/15/24 11:51 AM  Result Value Ref Range   Magnesium 1.5 (L) 1.7 - 2.4 mg/dL    Comment: Performed at [facility], [address] [place], [place] [address]  Phosphorus     Status: None   Collection Time: 07/15/24 11:51 AM  Result Value Ref Range   Phosphorus 3.4 2.5 - 4.6 mg/dL    Comment: Performed at [facility], [address] [place], [place] [address]  Pro Brain natriuretic peptide     Status: Abnormal   Collection Time: 07/15/24 11:51 AM  Result Value Ref Range   Pro Brain Natriuretic Peptide 352.0 (H) <300.0 pg/mL    Comment: (NOTE) Age Group        Cut-Points    Interpretation  < 50 years     450 pg/mL       NT-proBNP > 450 pg/mL indicates                                ADHF is likely              50 to 75 years  900 pg/mL      NT-proBNP > 900 pg/mL indicates          ADHF is likely  > 75 years      1800 pg/mL     NT-proBNP > 1800 pg/mL indicates          ADHF is  likely                           All ages    Results between       Indeterminate. Further clinical             300 and the cut-   information is needed to determine  point for age group   if ADHF is present.                                                             Elecsys proBNP II/ Elecsys proBNP II STAT           Cut-Point                       Interpretation  300 pg/mL                    NT-proBNP <300pg/mL indicates                             ADHF is not likely  Performed at [facility], [address] [place], [place] [address]   Lipid panel     Status: None   Collection Time: 07/15/24 11:51 AM  Result Value Ref Range   Cholesterol 119 0 - 200 mg/dL    Comment:        ATP III CLASSIFICATION:  <200     mg/dL   Desirable  799-760  mg/dL   Borderline High  >=759    mg/dL   High           Triglycerides 60 <150 mg/dL   HDL 53 >59 mg/dL   Total CHOL/HDL Ratio 2.3 RATIO   VLDL 12 0 - 40 mg/dL   LDL Cholesterol 54 0 - 99 mg/dL    Comment:        Total Cholesterol/HDL:CHD Risk Coronary Heart Disease Risk Table                     Men   Women  1/2 Average Risk   3.4   3.3  Average Risk       5.0   4.4  2 X Average Risk   9.6   7.1  3 X Average Risk  23.4   11.0        Use the calculated Patient Ratio above and the CHD Risk Table to determine the patient's CHD Risk.        ATP III CLASSIFICATION (LDL):  <100     mg/dL   Optimal  899-870  mg/dL   Near or Above                    Optimal  130-159  mg/dL   Borderline  839-810  mg/dL   High  >809     mg/dL   Very High Performed at [facility], [address], [place], [place] [address]   CBG monitoring, ED     Status: Abnormal   Collection Time: 07/15/24 11:55 AM  Result Value Ref Range   Glucose-Capillary 114 (H) 70 - 99 mg/dL    Comment: Glucose reference range applies only to samples taken after fasting for at least 8 hours.   CT ABDOMEN PELVIS W CONTRAST Result Date:  07/15/2024 EXAM: CT ABDOMEN AND PELVIS WITH CONTRAST 07/15/2024 09:17:00 AM TECHNIQUE: CT of the abdomen and pelvis was performed with the administration of intravenous contrast. Multiplanar reformatted images are provided for review. Automated exposure control, iterative reconstruction, and/or weight-based adjustment of the  mA/kV was utilized to reduce the radiation dose to as low as reasonably achievable. COMPARISON: CT abdomen and pelvis 03/09/2011. CLINICAL HISTORY: 49 year old male with acute severe pancreatitis, vomiting, abdominal pain, and upper back pain. FINDINGS: LOWER CHEST: Symmetric mild lung base atelectasis. LIVER AND GALLBLADDER AND BILE DUCTS: Large oblong 22 mm gallstone appears lodged in the neck of the gallbladder with moderate generalized gallbladder wall thickening and inflammation tracking from the gallbladder fossa to the porta hepatis. Subtle hyperenhancement of the adjacent right liver parenchyma, likely related to inflammation tracking from the gallbladder. No discrete liver lesion. See coronal images. The common bile duct is nondilated. No intrahepatic ductal dilatation. SPLEEN: No acute abnormality. PANCREAS: No pancreatic ductal dilatation. No evidence of pancreatic inflammation. ADRENAL GLANDS: No acute abnormality. KIDNEYS, URETERS AND BLADDER: No stones in the kidneys or ureters. No hydronephrosis. No perinephric or periureteral stranding. Distended but otherwise unremarkable urinary bladder. GI AND BOWEL: Small mostly mesenteric fat containing hiatal hernia is new since 2012. Diverticulosis of the proximal sigmoid colon. Mildly redundant distal large bowel, with retained large bowel gas. Normal appendix on series 2 image 65 tracking into the right lower quadrant. Decompressed terminal ileum and nondilated small bowel. PERITONEUM AND RETROPERITONEUM: No ascites. No free air. VASCULATURE: Aorta is normal in caliber. Major arterial structures and portal venous system patent. LYMPH  NODES: No lymphadenopathy. REPRODUCTIVE ORGANS: No acute abnormality. BONES AND SOFT TISSUES: Partially visible chronic right femur lateral plate and screw fixation. Occasional calcified pelvic phleboliths. No acute osseous abnormality. No focal soft tissue abnormality. IMPRESSION: 1. Acute cholecystitis due to relatively large gallstone (2.2 cm) lodged in the gallbladder neck. 2. No CT evidence of pancreatic inflammation. Electronically signed by: Helayne Hurst MD 07/15/2024 09:32 AM EST RP Workstation: [id number]   DG Chest 2 View Result Date: 07/15/2024 EXAM: 2 VIEW(S) XRAY OF THE CHEST 07/15/2024 01:41:00 AM COMPARISON: None available. CLINICAL HISTORY: Back pain. FINDINGS: LUNGS AND PLEURA: Hyperinflation. No focal pulmonary opacity. No pleural effusion. No pneumothorax. HEART AND MEDIASTINUM: No acute abnormality of the cardiac and mediastinal silhouettes. BONES AND SOFT TISSUES: No acute fracture or destructive lesion. Multilevel thoracic osteophytosis. IMPRESSION: 1. No acute cardiopulmonary abnormality. Electronically signed by: Norman Gatlin MD 07/15/2024 01:49 AM EST RP Workstation: [id number]      Assessment/Plan Cholecystitis -CT shows acute cholecystitis due to relatively large gallstone (2.2 cm) lodged in the gallbladder neck. No CT evidence of pancreatic inflammation. -Afebrile. -WBC without leukocytosis. -LFTs: Normal. -Exam with concerns of RUQ; +Murphy's sign. -Discussed labs, imaging, history, and symptoms with patient concerning for cholecystitis. Recommended laparoscopic cholecystectomy. I have explained the procedure, risks, and aftercare of Laparoscopic cholecystectomy with possible IOC. Risks include but are not limited to anesthesia (MI, CVA, death, prolonged intubation and aspiration), bleeding, infection, wound problems, hernia, bile leak, injury to common bile duct/liver/intestine, or possible need for open cholecystectomy, increased risk of DVT/PE and diarrhea post op. He  seems to understand and agrees to proceed.   Admit to medicine  FEN: NPO; IVF per primary team VTE: Heparin  injection held at this time.  ID: Rocephin   Per [facility] GERD DM HTN HLD  I reviewed EDP notes, hospitalist notes, nursing notes, last 24 h vitals and pain scores, last 48 h intake and output, last 24 h labs and trends, and last 24 h imaging results.  This care required high  level of medical decision making.   Marjorie Carlyon Favre, [facility] 07/15/2024, 1:32 PM Please see Amion for pager number during day hours  7:00am-4:30pm      [1]  Allergies Allergen Reactions   Atorvastatin  Nausea And Vomiting    NOT ALLERGIC**Atorvastatin  caused nausea / vomiting but also metformin  was started at the same time for DM2 causing diarrhea x 1 week . Most likely GI side effects were due to metformin    "

## 2024-07-15 NOTE — Hospital Course (Addendum)
 Christopher Hurley

## 2024-07-15 NOTE — Anesthesia Preprocedure Evaluation (Signed)
"                                    Anesthesia Evaluation  Patient identified by MRN, date of birth, ID band Patient awake    Reviewed: Allergy & Precautions, NPO status , Patient's Chart, lab work & pertinent test results  History of Anesthesia Complications Negative for: history of anesthetic complications  Airway Mallampati: II  TM Distance: >3 FB Neck ROM: Full    Dental no notable dental hx. (+) Teeth Intact   Pulmonary neg pulmonary ROS, neg sleep apnea, neg COPD, Patient abstained from smoking.Not current smoker   Pulmonary exam normal breath sounds clear to auscultation       Cardiovascular Exercise Tolerance: Good METShypertension, Pt. on medications (-) CAD and (-) Past MI (-) dysrhythmias  Rhythm:Regular Rate:Normal - Systolic murmurs    Neuro/Psych negative neurological ROS  negative psych ROS   GI/Hepatic ,GERD  ,,(+)     (-) substance abuse  Acute cholecystitis. Last vomiting earlier this morning   Endo/Other  diabetes  Patient denies any GLP1ra use  Renal/GU negative Renal ROS     Musculoskeletal   Abdominal  (+)  Abdomen: tender.   Peds  Hematology   Anesthesia Other Findings Past Medical History: No date: Diabetes mellitus without complication (HCC)     Comment:  on meds No date: Hyperlipidemia     Comment:  on meds No date: Hypertension     Comment:  on meds No date: Seasonal allergies No date: Ventral hernia  Reproductive/Obstetrics                              Anesthesia Physical Anesthesia Plan  ASA: 2  Anesthesia Plan: General   Post-op Pain Management: Ofirmev  IV (intra-op)* and Toradol IV (intra-op)*   Induction: Intravenous and Rapid sequence  PONV Risk Score and Plan: 3 and Ondansetron , Dexamethasone and Midazolam   Airway Management Planned: Oral ETT  Additional Equipment: None  Intra-op Plan:   Post-operative Plan: Extubation in OR  Informed Consent: I have reviewed the  patients History and Physical, chart, labs and discussed the procedure including the risks, benefits and alternatives for the proposed anesthesia with the patient or authorized representative who has indicated his/her understanding and acceptance.     Dental advisory given  Plan Discussed with: CRNA and Surgeon  Anesthesia Plan Comments: (Discussed risks of anesthesia with patient, including PONV, sore throat, lip/dental/eye damage. Rare risks discussed as well, such as cardiorespiratory and neurological sequelae, and allergic reactions. Discussed the role of CRNA in patient's perioperative care. Patient understands.)        Anesthesia Quick Evaluation  "

## 2024-07-15 NOTE — Transfer of Care (Signed)
 Immediate Anesthesia Transfer of Care Note  Patient: [facility]  Procedure(s) Performed: LAPAROSCOPIC CHOLECYSTECTOMY (Abdomen)  Patient Location: PACU  Anesthesia Type:General  Level of Consciousness: awake, alert , and oriented  Airway & Oxygen Therapy: Patient Spontanous Breathing  Post-op Assessment: Report given to RN and Post -op Vital signs reviewed and stable  Post vital signs: Reviewed and stable  Last Vitals:  Vitals Value Taken Time  BP    Temp    Pulse 87 07/15/24 15:45  Resp 16 07/15/24 15:45  SpO2 100 % 07/15/24 15:45  Vitals shown include unfiled device data.  Last Pain:  Vitals:   07/15/24 1332  TempSrc: Oral  PainSc:          Complications: There were no known notable events for this encounter.

## 2024-07-15 NOTE — Assessment & Plan Note (Signed)
 When tolerating p.o., continue Zetia  and atorvastatin 

## 2024-07-15 NOTE — Assessment & Plan Note (Signed)
-   N.p.o. -Checking CBG every 4 hours, SSI coverage -Home medication of semaglutide /Ozempic  reviewed -q. weekly injection may be resumed -Recheck an A1c (last was 7.8)

## 2024-07-15 NOTE — Anesthesia Procedure Notes (Signed)
 Procedure Name: Intubation Date/Time: 07/15/2024 2:46 PM  Performed by: Alen Motto D, CRNAPre-anesthesia Checklist: Patient identified, Emergency Drugs available, Suction available and Patient being monitored Patient Re-evaluated:Patient Re-evaluated prior to induction Oxygen Delivery Method: Circle System Utilized Preoxygenation: Pre-oxygenation with 100% oxygen Induction Type: IV induction Ventilation: Mask ventilation without difficulty Laryngoscope Size: Mac and 4 Grade View: Grade I Tube type: Oral Number of attempts: 1 Airway Equipment and Method: Stylet and Oral airway Placement Confirmation: ETT inserted through vocal cords under direct vision, positive ETCO2 and breath sounds checked- equal and bilateral Secured at: 22 cm Tube secured with: Tape Dental Injury: Teeth and Oropharynx as per pre-operative assessment

## 2024-07-15 NOTE — Assessment & Plan Note (Signed)
 Blood pressure stable -Continue as needed IV hydralazine , -Once tolerating p.o., continue lisinopril /HCTZ

## 2024-07-15 NOTE — Assessment & Plan Note (Signed)
 Starting IV Protonix  40 mg twice daily

## 2024-07-16 ENCOUNTER — Encounter (HOSPITAL_COMMUNITY): Payer: Self-pay | Admitting: Surgery

## 2024-07-16 ENCOUNTER — Telehealth: Payer: Self-pay

## 2024-07-16 LAB — BASIC METABOLIC PANEL WITH GFR
Anion gap: 10 (ref 5–15)
BUN: 7 mg/dL (ref 6–20)
CO2: 24 mmol/L (ref 22–32)
Calcium: 8.2 mg/dL — ABNORMAL LOW (ref 8.9–10.3)
Chloride: 106 mmol/L (ref 98–111)
Creatinine, Ser: 0.68 mg/dL (ref 0.61–1.24)
GFR, Estimated: >60 mL/min
Glucose, Bld: 136 mg/dL — ABNORMAL HIGH (ref 70–99)
Potassium: 3.3 mmol/L — ABNORMAL LOW (ref 3.5–5.1)
Sodium: 140 mmol/L (ref 135–145)

## 2024-07-16 LAB — CBC
HCT: 36.5 % — ABNORMAL LOW (ref 39.0–52.0)
Hemoglobin: 12.9 g/dL — ABNORMAL LOW (ref 13.0–17.0)
MCH: 32.7 pg (ref 26.0–34.0)
MCHC: 35.3 g/dL (ref 30.0–36.0)
MCV: 92.4 fL (ref 80.0–100.0)
Platelets: 172 10*3/uL (ref 150–400)
RBC: 3.95 MIL/uL — ABNORMAL LOW (ref 4.22–5.81)
RDW: 12.9 % (ref 11.5–15.5)
WBC: 7.9 10*3/uL (ref 4.0–10.5)
nRBC: 0 % (ref 0.0–0.2)

## 2024-07-16 LAB — GLUCOSE, CAPILLARY
Glucose-Capillary: 114 mg/dL — ABNORMAL HIGH (ref 70–99)
Glucose-Capillary: 144 mg/dL — ABNORMAL HIGH (ref 70–99)
Glucose-Capillary: 146 mg/dL — ABNORMAL HIGH (ref 70–99)
Glucose-Capillary: 171 mg/dL — ABNORMAL HIGH (ref 70–99)

## 2024-07-16 NOTE — Progress Notes (Signed)
" °   07/16/24 1020  TOC Brief Assessment  Insurance and Status Reviewed  Patient has primary care physician No (appointment scheduled and information placed on AVS)  Home environment has been reviewed spouse  Prior level of function: independent , await PT evaluation  Prior/Current Home Services No current home services  Social Drivers of Health Review SDOH reviewed no interventions necessary  Transition of care needs no transition of care needs at this time (await PT evaluation)     No PCP , appointment scheduled and information placed on AVS.  Await PT evaluation  "

## 2024-07-16 NOTE — Telephone Encounter (Signed)
 Copied from CRM [id number]. Topic: Appointments - Transfer of Care >> Jul 16, 2024 10:05 AM Mia F wrote: Pt is requesting to transfer FROM: Theophilus Andrews, Tully GRADE, MD Pt is requesting to transfer TO: Sula Cower Reason for requested transfer: New pcp  It is the responsibility of the team the patient would like to transfer to PA-C. Sula Cower) to reach out to the patient if for any reason this transfer is not acceptable.

## 2024-07-16 NOTE — Progress Notes (Signed)
 DC planned for today-AVS printed and reviewed-PIV removed-pt dc home in stable condition-

## 2024-07-16 NOTE — Evaluation (Signed)
 Physical Therapy Brief Evaluation and Discharge Note Patient Details Name: Christopher Hurley MRN: [id number] DOB: 09/23/1975 Today's Date: 07/16/2024   History of Present Illness  Pt is a 49 y.o. M who presents 07/15/2024 with abdominal pain and admitted with acute cholecystitis now s/p laparoscopic cholecystectomy. Significant PMH: T2DM, HTN, HLD.  Clinical Impression  Patient evaluated by Physical Therapy with no further acute PT needs identified. PTA, pt lives with his family in a single level house with 2 steps to enter. Pt reports 6/10 pain and was premedicated. Education provided regarding activity recommendations, lifting restrictions and log roll technique for bed mobility. Pt ambulating 250 ft with no assistive device and negotiated 2 steps to simulate home entrance without physical assist. All education has been completed and the patient has no further questions. No follow-up Physical Therapy or equipment needs. PT is signing off. Thank you for this referral.  Shanda, in house interpreter, assisted with this session        PT Assessment Patient does not need any further PT services  Assistance Needed at Discharge  PRN    Equipment Recommendations None recommended by PT  Recommendations for Other Services       Precautions/Restrictions Precautions Precautions: None Restrictions Weight Bearing Restrictions Per Provider Order: No        Mobility  Bed Mobility Rolling: Modified independent (Device/Increase time) Supine/Sidelying to sit: Modified independent (Device/Increased time)   General bed mobility comments: HOB flat, cues for log roll technique, use of rail  Transfers Overall transfer level: Modified independent Equipment used: Rolling walker (2 wheels), None                    Ambulation/Gait Ambulation/Gait assistance: Modified independent (Device/Increase time) Gait Distance (Feet): 250 Feet Assistive device: None Gait Pattern/deviations:  WFL(Within Functional Limits)      Home Activity Instructions    Stairs Stairs: Yes Stairs assistance: Contact guard assist Stair Management: No rails Number of Stairs: 2 General stair comments: handheld assist, cues for step by step pattern  Modified Rankin (Stroke Patients Only)        Balance Overall balance assessment: No apparent balance deficits (not formally assessed)                        Pertinent Vitals/Pain PT - Brief Vital Signs All Vital Signs Stable: Yes Pain Assessment Pain Assessment: 0-10 Pain Score: 6  Pain Location: surgical site Pain Descriptors / Indicators: Grimacing, Guarding Pain Intervention(s): Monitored during session, Premedicated before session     Home Living Family/patient expects to be discharged to:: Private residence Living Arrangements: Spouse/significant other;Children (10, 62, 47 y.o.) Available Help at Discharge: Family Home Environment: Stairs to enter  Landscape Architect of Steps: 2 Home Equipment: None        Prior Function Level of Independence: Independent Comments: Works as Insurance Underwriter   UE ROM/Strength/Tone/Coordination: [employer]    LE ROM/Strength/Tone/Coordination: [employer]      Communication   Communication Communication: No apparent difficulties     Cognition Overall Cognitive Status: Appears within functional limits for tasks assessed/performed       General Comments      Exercises     Assessment/Plan    PT Problem List         PT Visit Diagnosis Pain    No Skilled PT All education completed;Patient will have necessary level of assist by caregiver at discharge;Patient is modified independent with all activity/mobility  Co-evaluation                AMPAC 6 Clicks Help needed turning from your back to your side while in a flat bed without using bedrails?: None Help needed moving from lying on your back to sitting on the side of a flat bed without using bedrails?:  None Help needed moving to and from a bed to a chair (including a wheelchair)?: None Help needed standing up from a chair using your arms (e.g., wheelchair or bedside chair)?: None Help needed to walk in hospital room?: None Help needed climbing 3-5 steps with a railing? : None 6 Click Score: 24      End of Session   Activity Tolerance: Patient tolerated treatment well Patient left: in chair;with call bell/phone within reach;with family/visitor present Nurse Communication: Mobility status PT Visit Diagnosis: Pain Pain - part of body:  (abdomen)     Time: 8899-8875 PT Time Calculation (min) (ACUTE ONLY): 24 min  Charges:   PT Evaluation $PT Eval Low Complexity: 1 Low      Aleck Daring, PT, DPT Acute Rehabilitation Services Office [phone]   Aleck ONEIDA Daring  07/16/2024, 12:07 PM

## 2024-07-16 NOTE — Discharge Summary (Signed)
 "  Physician Discharge Summary  [place] FMW:[id number] DOB: 1976-02-01 DOA: 07/15/2024  PCP: Pcp, No  Admit date: 07/15/2024 Discharge date: 07/16/2024  Admitted From: Home  Discharge disposition: Home   Recommendations for Outpatient Follow-Up:   Follow up with your primary care provider in one week.  Check CBC, BMP, magnesium in the next visit Follow-up with general surgery as outpatient for postsurgical follow-up.  Discharge Diagnosis:   Principal Problem:   Acute cholecystitis Active Problems:   DM (diabetes mellitus), type 2 (HCC)   Gastroesophageal reflux disease   Hyperlipidemia   Benign essential hypertension    Discharge Condition: Improved.  Diet recommendation: Low sodium, heart healthy.  Carbohydrate-modified.    Wound care: None.  Code status: Full.   History of Present Illness:   Christopher Hurley is 49 year old male with extensive history of diabetes mellitus type 2, hypertension, hyperlipidemia presented to hospital with nausea vomiting abdominal pain mostly with epigastric region without any radiation.  In the ED vitals were stable.  Labs were within normal limits except for elevated lipase at 243 and glucose of 207.  CT scan of the abdomen and pelvis showed acute cholecystitis due to large gallstone 2.2 cm lodged in the gallbladder neck.  No pancreatic inflammation was identified.  Patient was then admitted to the hospital for further evaluation and treatment.    Hospital Course:   Following conditions were addressed during hospitalization as listed below,  Acute calculous cholecystitis.  General surgery was consulted and patient underwent laparoscopic cholecystectomy on 07/15/2024.  At this time patient has been seen by general surgery and he is okay for discharge home.  Patient will follow-up with general surgery as outpatient.   Diabetes mellitus type 2.   Patient is on metformin  1000 mg twice a day at home with pioglitazone.  Resume on  discharge with diabetic diet   Essential hypertension.SABRA  Resume lisinopril  HCTZ as outpatient.   Hyperlipidemia.  Patient is on statins and ezetimibe  as outpatient..  Will be resumed on discharge.  Disposition.  At this time, patient is stable for disposition home with outpatient PCP and general surgery follow-up.  Medical Consultants:   None.  Procedures:    Laparoscopic cholecystectomy 07/15/2024 Subjective:   Today, patient was seen and examined at bedside.  Patient denies overt pain.  Passing gas and has tolerated oral diet.  No nausea and vomiting.  Discharge Exam:   Vitals:   07/16/24 0402 07/16/24 0824  BP: (!) 91/59 111/60  Pulse: 77 84  Resp: 17 18  Temp: 99.9 F (37.7 C) 98.7 F (37.1 C)  SpO2: 96% 96%   Vitals:   07/15/24 2155 07/16/24 0402 07/16/24 0500 07/16/24 0824  BP: 94/66 (!) 91/59  111/60  Pulse: 71 77  84  Resp: 20 17  18   Temp: 99.3 F (37.4 C) 99.9 F (37.7 C)  98.7 F (37.1 C)  TempSrc: Oral Oral  Oral  SpO2: 100% 96%  96%  Weight:   83.9 kg   Height:        General: Alert awake, not in obvious distress HENT: pupils equally reacting to light,  No scleral pallor or icterus noted. Oral mucosa is moist.  Chest:  Clear breath sounds.  Diminished breath sounds bilaterally. No crackles or wheezes.  CVS: S1 &S2 heard. No murmur.  Regular rate and rhythm. Abdomen: Soft, nontender, nondistended.  Bowel sounds are heard.  Laparoscopic scars Extremities: No cyanosis, clubbing or edema.  Peripheral pulses are palpable. Psych: Alert, awake  and oriented, normal mood CNS:  No cranial nerve deficits.  Power equal in all extremities.   Skin: Warm and dry.  No rashes noted.  The results of significant diagnostics from this hospitalization (including imaging, microbiology, ancillary and laboratory) are listed below for reference.     Diagnostic Studies:   CT ABDOMEN PELVIS W CONTRAST Result Date: 07/15/2024 EXAM: CT ABDOMEN AND PELVIS WITH CONTRAST  07/15/2024 09:17:00 AM TECHNIQUE: CT of the abdomen and pelvis was performed with the administration of intravenous contrast. Multiplanar reformatted images are provided for review. Automated exposure control, iterative reconstruction, and/or weight-based adjustment of the mA/kV was utilized to reduce the radiation dose to as low as reasonably achievable. COMPARISON: CT abdomen and pelvis 03/09/2011. CLINICAL HISTORY: 49 year old male with acute severe pancreatitis, vomiting, abdominal pain, and upper back pain. FINDINGS: LOWER CHEST: Symmetric mild lung base atelectasis. LIVER AND GALLBLADDER AND BILE DUCTS: Large oblong 22 mm gallstone appears lodged in the neck of the gallbladder with moderate generalized gallbladder wall thickening and inflammation tracking from the gallbladder fossa to the porta hepatis. Subtle hyperenhancement of the adjacent right liver parenchyma, likely related to inflammation tracking from the gallbladder. No discrete liver lesion. See coronal images. The common bile duct is nondilated. No intrahepatic ductal dilatation. SPLEEN: No acute abnormality. PANCREAS: No pancreatic ductal dilatation. No evidence of pancreatic inflammation. ADRENAL GLANDS: No acute abnormality. KIDNEYS, URETERS AND BLADDER: No stones in the kidneys or ureters. No hydronephrosis. No perinephric or periureteral stranding. Distended but otherwise unremarkable urinary bladder. GI AND BOWEL: Small mostly mesenteric fat containing hiatal hernia is new since 2012. Diverticulosis of the proximal sigmoid colon. Mildly redundant distal large bowel, with retained large bowel gas. Normal appendix on series 2 image 65 tracking into the right lower quadrant. Decompressed terminal ileum and nondilated small bowel. PERITONEUM AND RETROPERITONEUM: No ascites. No free air. VASCULATURE: Aorta is normal in caliber. Major arterial structures and portal venous system patent. LYMPH NODES: No lymphadenopathy. REPRODUCTIVE ORGANS: No acute  abnormality. BONES AND SOFT TISSUES: Partially visible chronic right femur lateral plate and screw fixation. Occasional calcified pelvic phleboliths. No acute osseous abnormality. No focal soft tissue abnormality. IMPRESSION: 1. Acute cholecystitis due to relatively large gallstone (2.2 cm) lodged in the gallbladder neck. 2. No CT evidence of pancreatic inflammation. Electronically signed by: Helayne Hurst MD 07/15/2024 09:32 AM EST RP Workstation: [id number]   DG Chest 2 View Result Date: 07/15/2024 EXAM: 2 VIEW(S) XRAY OF THE CHEST 07/15/2024 01:41:00 AM COMPARISON: None available. CLINICAL HISTORY: Back pain. FINDINGS: LUNGS AND PLEURA: Hyperinflation. No focal pulmonary opacity. No pleural effusion. No pneumothorax. HEART AND MEDIASTINUM: No acute abnormality of the cardiac and mediastinal silhouettes. BONES AND SOFT TISSUES: No acute fracture or destructive lesion. Multilevel thoracic osteophytosis. IMPRESSION: 1. No acute cardiopulmonary abnormality. Electronically signed by: Norman Gatlin MD 07/15/2024 01:49 AM EST RP Workstation: [id number]     Labs:   Basic Metabolic Panel: Recent Labs  Lab 07/15/24 0132 07/15/24 1151 07/16/24 0431  NA 136  --  140  K 3.7  --  3.3*  CL 99  --  106  CO2 25  --  24  GLUCOSE 207*  --  136*  BUN 16  --  7  CREATININE 0.73  --  0.68  CALCIUM  9.6  --  8.2*  MG  --  1.5*  --   PHOS  --  3.4  --    GFR Estimated Creatinine Clearance: 117.9 mL/min (by C-G formula based on SCr of 0.68 mg/dL).  Liver Function Tests: Recent Labs  Lab 07/15/24 0132  AST 23  ALT 27  ALKPHOS 86  BILITOT 0.4  PROT 7.3  ALBUMIN 4.6   Recent Labs  Lab 07/15/24 0132  LIPASE 243*   No results for input(s): AMMONIA in the last 168 hours. Coagulation profile No results for input(s): INR, PROTIME in the last 168 hours.  CBC: Recent Labs  Lab 07/15/24 0132 07/16/24 0431  WBC 9.6 7.9  HGB 14.2 12.9*  HCT 40.3 36.5*  MCV 93.1 92.4  PLT 202 172   Cardiac  Enzymes: No results for input(s): CKTOTAL, CKMB, CKMBINDEX, TROPONINI in the last 168 hours. BNP: Invalid input(s): POCBNP CBG: Recent Labs  Lab 07/15/24 2007 07/16/24 0026 07/16/24 0823 07/16/24 1134 07/16/24 1556  GLUCAP 130* 144* 146* 114* 171*   D-Dimer No results for input(s): DDIMER in the last 72 hours. Hgb A1c Recent Labs    07/15/24 0132  HGBA1C 7.3*   Lipid Profile Recent Labs    07/15/24 1151  CHOL 119  HDL 53  LDLCALC 54  TRIG 60  CHOLHDL 2.3   Thyroid function studies No results for input(s): TSH, T4TOTAL, T3FREE, THYROIDAB in the last 72 hours.  Invalid input(s): FREET3 Anemia work up No results for input(s): VITAMINB12, FOLATE, FERRITIN, TIBC, IRON, RETICCTPCT in the last 72 hours. Microbiology No results found for this or any previous visit (from the past 240 hours).   Discharge Instructions:   Discharge Instructions     Call MD for:  persistant nausea and vomiting   Complete by: As directed    Call MD for:  severe uncontrolled pain   Complete by: As directed    Call MD for:  temperature >100.4   Complete by: As directed    Diet general   Complete by: As directed    Discharge instructions   Complete by: As directed    Follow-up with your primary care provider in 1 week.  Follow-up with general surgery as scheduled by the clinic. Seek medical attention for worsening symptoms.   Increase activity slowly   Complete by: As directed       Allergies as of 07/16/2024       Reactions   Atorvastatin  Nausea And Vomiting   NOT ALLERGIC**Atorvastatin  caused nausea / vomiting but also metformin  was started at the same time for DM2 causing diarrhea x 1 week . Most likely GI side effects were due to metformin         Medication List     TAKE these medications    acetaminophen  500 MG tablet Commonly known as: TYLENOL  Take 2 tablets (1,000 mg total) by mouth 4 (four) times daily.   atorvastatin  80 MG  tablet Commonly known as: LIPITOR Take 1 tablet (80 mg total) by mouth daily.   ibuprofen  600 MG tablet Commonly known as: ADVIL  Take 1 tablet (600 mg total) by mouth every 6 (six) hours as needed.   lisinopril -hydrochlorothiazide  20-25 MG tablet Commonly known as: ZESTORETIC  TAKE 1 TABLET BY MOUTH EVERY DAY   metFORMIN  500 MG tablet Commonly known as: GLUCOPHAGE  Take 1,000 mg by mouth 2 (two) times daily.   methocarbamol  750 MG tablet Commonly known as: ROBAXIN  Take 1 tablet (750 mg total) by mouth 4 (four) times daily.   omeprazole 20 MG capsule Commonly known as: PRILOSEC Take 20 mg by mouth daily.   oxyCODONE  5 MG immediate release tablet Commonly known as: Roxicodone  Take 1 tablet (5 mg total) by mouth every 4 (four) hours as  needed.   pioglitazone 15 MG tablet Commonly known as: ACTOS Take 15 mg by mouth daily.   polyethylene glycol powder 17 GM/SCOOP powder Commonly known as: MiraLax  Take 17 g by mouth daily. Dissolve 1 capful (17g) in 4-8 ounces of liquid and take by mouth daily.   PRESERVISION AREDS PO Take 1 tablet by mouth daily at 6 (six) AM.   Vitamin D  (Ergocalciferol ) 1.25 MG (50000 UNIT) Caps capsule Commonly known as: DRISDOL  Take 50,000 Units by mouth once a week.        Follow-up Information     Maczis, Puja Gosai, PA-C. Go on 08/05/2024.   Specialty: General Surgery Why: At 1:30PM, Arrive 30 mins prior to scheduled appointment time, Please bring your ID and insurance cards Contact information: [address] [place] [address] [facility] [place] [address] [phone]         Leavy Lucas Fox, PA-C Follow up.   Specialty: Physician Assistant Why: TIME : 1:00 PM   PLEASE ARRIVE AT 12:30 PM DATE : MARCH 10 , 2026 TUESDAY  PLEASE BRING ALL CURRENT MEDICATION,ID and INS CARD, CO-PY Contact information: [address], [address] [place] [address] [phone]                  Time coordinating  discharge: 39 minutes  Signed:  Kynisha Memon  [facility] 07/16/2024, 4:44 PM          "

## 2024-07-16 NOTE — Telephone Encounter (Signed)
 Accept

## 2024-07-16 NOTE — Progress Notes (Signed)
 "  Progress Note  1 Day Post-Op  Subjective: This encounter was conducted with Spanish interpreting services.   Patient reports generalized soreness. Does not complain of worsening pain. Has not had anything to eat since surgery. Denies BM but is passing flatus. Denies n/v.   ROS  All negative with the exception of above.  Objective: Vital signs in last 24 hours: Temp:  [98.2 F (36.8 C)-100.1 F (37.8 C)] 98.7 F (37.1 C) (02/05 0824) Pulse Rate:  [63-88] 84 (02/05 0824) Resp:  [13-20] 18 (02/05 0824) BP: (91-144)/(59-90) 111/60 (02/05 0824) SpO2:  [96 %-100 %] 96 % (02/05 0824) Weight:  [83.9 kg] 83.9 kg (02/05 0500)    Intake/Output from previous day: 02/04 0701 - 02/05 0700 In: 1367.1 [P.O.:720; I.V.:647.1] Out: -  Intake/Output this shift: No intake/output data recorded.  PE: General: Pleasant male who is laying in bed in NAD. HEENT: Head is normocephalic, atraumatic.  Sclera are noninjected. EOMI. Ears and nose without any masses or lesions. Mouth is pink and moist. Heart: HR normal during encounter.  Lungs: Respiratory effort nonlabored on room air. Abd: Soft with mild distention. Appropriate generalized tenderness to palpation. Laparoscopic incision with dermabond C/D/I. No rebound tenderness or guarding.  MS: Able to move all 4 extremities. Skin: Warm and dry.  Psych: A&Ox3 with an appropriate affect.   Lab Results:  Recent Labs    07/15/24 0132 07/16/24 0431  WBC 9.6 7.9  HGB 14.2 12.9*  HCT 40.3 36.5*  PLT 202 172   BMET Recent Labs    07/15/24 0132 07/16/24 0431  NA 136 140  K 3.7 3.3*  CL 99 106  CO2 25 24  GLUCOSE 207* 136*  BUN 16 7  CREATININE 0.73 0.68  CALCIUM  9.6 8.2*   PT/INR No results for input(s): LABPROT, INR in the last 72 hours. CMP     Component Value Date/Time   NA 140 07/16/2024 0431   K 3.3 (L) 07/16/2024 0431   CL 106 07/16/2024 0431   CO2 24 07/16/2024 0431   GLUCOSE 136 (H) 07/16/2024 0431   BUN 7  07/16/2024 0431   CREATININE 0.68 07/16/2024 0431   CALCIUM  8.2 (L) 07/16/2024 0431   PROT 7.3 07/15/2024 0132   ALBUMIN 4.6 07/15/2024 0132   AST 23 07/15/2024 0132   ALT 27 07/15/2024 0132   ALKPHOS 86 07/15/2024 0132   BILITOT 0.4 07/15/2024 0132   GFRNONAA >60 07/16/2024 0431   GFRAA >60 01/06/2015 1310   Lipase     Component Value Date/Time   LIPASE 243 (H) 07/15/2024 0132       Studies/Results: CT ABDOMEN PELVIS W CONTRAST Result Date: 07/15/2024 EXAM: CT ABDOMEN AND PELVIS WITH CONTRAST 07/15/2024 09:17:00 AM TECHNIQUE: CT of the abdomen and pelvis was performed with the administration of intravenous contrast. Multiplanar reformatted images are provided for review. Automated exposure control, iterative reconstruction, and/or weight-based adjustment of the mA/kV was utilized to reduce the radiation dose to as low as reasonably achievable. COMPARISON: CT abdomen and pelvis 03/09/2011. CLINICAL HISTORY: 49 year old male with acute severe pancreatitis, vomiting, abdominal pain, and upper back pain. FINDINGS: LOWER CHEST: Symmetric mild lung base atelectasis. LIVER AND GALLBLADDER AND BILE DUCTS: Large oblong 22 mm gallstone appears lodged in the neck of the gallbladder with moderate generalized gallbladder wall thickening and inflammation tracking from the gallbladder fossa to the porta hepatis. Subtle hyperenhancement of the adjacent right liver parenchyma, likely related to inflammation tracking from the gallbladder. No discrete liver lesion. See coronal images.  The common bile duct is nondilated. No intrahepatic ductal dilatation. SPLEEN: No acute abnormality. PANCREAS: No pancreatic ductal dilatation. No evidence of pancreatic inflammation. ADRENAL GLANDS: No acute abnormality. KIDNEYS, URETERS AND BLADDER: No stones in the kidneys or ureters. No hydronephrosis. No perinephric or periureteral stranding. Distended but otherwise unremarkable urinary bladder. GI AND BOWEL: Small mostly  mesenteric fat containing hiatal hernia is new since 2012. Diverticulosis of the proximal sigmoid colon. Mildly redundant distal large bowel, with retained large bowel gas. Normal appendix on series 2 image 65 tracking into the right lower quadrant. Decompressed terminal ileum and nondilated small bowel. PERITONEUM AND RETROPERITONEUM: No ascites. No free air. VASCULATURE: Aorta is normal in caliber. Major arterial structures and portal venous system patent. LYMPH NODES: No lymphadenopathy. REPRODUCTIVE ORGANS: No acute abnormality. BONES AND SOFT TISSUES: Partially visible chronic right femur lateral plate and screw fixation. Occasional calcified pelvic phleboliths. No acute osseous abnormality. No focal soft tissue abnormality. IMPRESSION: 1. Acute cholecystitis due to relatively large gallstone (2.2 cm) lodged in the gallbladder neck. 2. No CT evidence of pancreatic inflammation. Electronically signed by: Helayne Hurst MD 07/15/2024 09:32 AM EST RP Workstation: [id number]   DG Chest 2 View Result Date: 07/15/2024 EXAM: 2 VIEW(S) XRAY OF THE CHEST 07/15/2024 01:41:00 AM COMPARISON: None available. CLINICAL HISTORY: Back pain. FINDINGS: LUNGS AND PLEURA: Hyperinflation. No focal pulmonary opacity. No pleural effusion. No pneumothorax. HEART AND MEDIASTINUM: No acute abnormality of the cardiac and mediastinal silhouettes. BONES AND SOFT TISSUES: No acute fracture or destructive lesion. Multilevel thoracic osteophytosis. IMPRESSION: 1. No acute cardiopulmonary abnormality. Electronically signed by: Norman Gatlin MD 07/15/2024 01:49 AM EST RP Workstation: [id number]    Anti-infectives: Anti-infectives (From admission, onward)    Start     Dose/Rate Route Frequency Ordered Stop   07/16/24 1000  cefTRIAXone  (ROCEPHIN ) 2 g in sodium chloride  0.9 % 100 mL IVPB        2 g 200 mL/hr over 30 Minutes Intravenous Every 24 hours 07/15/24 1142     07/15/24 1015  cefTRIAXone  (ROCEPHIN ) 2 g in sodium chloride  0.9 % 100  mL IVPB        2 g 200 mL/hr over 30 Minutes Intravenous  Once 07/15/24 1000 07/15/24 1048        Assessment/Plan POD1: S/P laparoscopic cholecystectomy with Dr. Paola on 2/4 -Afebrile.  -WBC 7.9; HGB 12.9 -Having generalized soreness. Has not had PO intake since surgery. No BM but having flatulence. Denies n/v. -No significant concerns on physical exam.  -Discussed post-operative restrictions and expectations in great detail with patient and his significant other. Does not need a work physicist, medical. Follow up has been arranged. If patient tolerates PO intake, he is stable for discharge from general surgery standpoint.   FEN: ADAT; IVF per primary team VTE: Heparin  injection ID: [id number]    LOS: 1 day   I reviewed nursing notes, specialist notes, hospitalist notes, last 24 h vitals and pain scores, last 48 h intake and output, last 24 h labs and trends, and last 24 h imaging results.   Marjorie Carlyon Favre, [facility] 07/16/2024, 9:21 AM Please see Amion for pager number during day hours 7:00am-4:30pm  "

## 2024-07-16 NOTE — Plan of Care (Signed)
  Problem: Clinical Measurements: Goal: Will remain free from infection Outcome: Progressing   Problem: Skin Integrity: Goal: Risk for impaired skin integrity will decrease Outcome: Progressing
